# Patient Record
Sex: Female | Born: 1985 | Race: White | Hispanic: No | Marital: Married | Smoking: Never smoker
Health system: Southern US, Community
[De-identification: ages and names within clinical notes are randomized; demographics above are authoritative.]

## PROBLEM LIST (undated history)

## (undated) DIAGNOSIS — Z789 Other specified health status: Secondary | ICD-10-CM

## (undated) DIAGNOSIS — A6000 Herpesviral infection of urogenital system, unspecified: Secondary | ICD-10-CM

## (undated) HISTORY — DX: Other specified health status: Z78.9

## (undated) HISTORY — DX: Herpesviral infection of urogenital system, unspecified: A60.00

---

## 2017-10-16 ENCOUNTER — Ambulatory Visit (INDEPENDENT_AMBULATORY_CARE_PROVIDER_SITE_OTHER): Payer: 59 | Admitting: Certified Nurse Midwife

## 2017-10-16 ENCOUNTER — Encounter: Payer: Self-pay | Admitting: Certified Nurse Midwife

## 2017-10-16 ENCOUNTER — Other Ambulatory Visit (HOSPITAL_COMMUNITY)
Admission: RE | Admit: 2017-10-16 | Discharge: 2017-10-16 | Disposition: A | Discharge status: Home / Self Care | Payer: 59 | Source: Ambulatory Visit | Attending: Certified Nurse Midwife | Admitting: Certified Nurse Midwife

## 2017-10-16 VITALS — BP 136/86 | HR 66 | Ht 64.0 in | Wt 146.0 lb

## 2017-10-16 DIAGNOSIS — Z1151 Encounter for screening for human papillomavirus (HPV): Secondary | ICD-10-CM | POA: Insufficient documentation

## 2017-10-16 DIAGNOSIS — Z01419 Encounter for gynecological examination (general) (routine) without abnormal findings: Secondary | ICD-10-CM | POA: Diagnosis not present

## 2017-10-16 DIAGNOSIS — E663 Overweight: Secondary | ICD-10-CM

## 2017-10-16 DIAGNOSIS — Z800 Family history of malignant neoplasm of digestive organs: Secondary | ICD-10-CM

## 2017-10-16 DIAGNOSIS — Z803 Family history of malignant neoplasm of breast: Secondary | ICD-10-CM

## 2017-10-16 DIAGNOSIS — Z8619 Personal history of other infectious and parasitic diseases: Secondary | ICD-10-CM | POA: Diagnosis not present

## 2017-10-16 DIAGNOSIS — Z124 Encounter for screening for malignant neoplasm of cervix: Secondary | ICD-10-CM | POA: Insufficient documentation

## 2017-10-16 MED ORDER — VALACYCLOVIR HCL 500 MG PO TABS: 500.0000 mg | ORAL_TABLET | Freq: Every day | ORAL | 12 refills | Status: DC

## 2017-10-16 NOTE — Progress Notes (Signed)
ANNUAL PREVENTATIVE CARE GYN  ENCOUNTER NOTE  Subjective:       Taylor Kirk is a 32 y.o. No obstetric history on file. female here for a routine annual gynecologic exam.  Current complaints: 1. Needs Pap 2. Fatigue 3. Decreased libido  Actively trying to conceive for the last two (2) months. Married since June.   Denies difficulty breathing or respiratory distress, chest pain, abdominal pain, excessive vaginal bleeding, dysuria, and leg pain or swelling.    Gynecologic History  Patient's last menstrual period was 09/25/2017 (exact date).  Period Cycle (Days): 32 Period Duration (Days): Five (5) to six (6)  Period Pattern: (!) Irregular  Contraception: none   Last Pap: 2016. Results were: normal  Obstetric History  OB History  Gravida Para Term Preterm AB Living  0 0 0 0 0 0  SAB TAB Ectopic Multiple Live Births  0 0 0 0 0    Past Medical History:  Diagnosis Date  . Genital herpes     History reviewed. No pertinent surgical history.   Allergies  Allergen Reactions  . Sulfa Antibiotics Rash    Social History   Socioeconomic History  . Marital status: Married    Spouse name: Not on file  . Number of children: Not on file  . Years of education: Not on file  . Highest education level: Not on file  Occupational History  . Occupation: Physical Therapist  Social Needs  . Financial resource strain: Not on file  . Food insecurity:    Worry: Not on file    Inability: Not on file  . Transportation needs:    Medical: Not on file    Non-medical: Not on file  Tobacco Use  . Smoking status: Not on file  Substance and Sexual Activity  . Alcohol use: Not on file  . Drug use: Not on file  . Sexual activity: Not on file  Lifestyle  . Physical activity:    Days per week: Not on file    Minutes per session: Not on file  . Stress: Not on file  Relationships  . Social connections:    Talks on phone: Not on file    Gets together: Not on file    Attends  religious service: Not on file    Active member of club or organization: Not on file    Attends meetings of clubs or organizations: Not on file    Relationship status: Married  . Intimate partner violence:    Fear of current or ex partner: Not on file    Emotionally abused: Not on file    Physically abused: Not on file    Forced sexual activity: Not on file  Other Topics Concern  . Not on file  Social History Narrative  . Not on file    Family History  Problem Relation Age of Onset  . Pancreatic cancer Paternal Grandfather   . Breast cancer Maternal Aunt     The following portions of the patient's history were reviewed and updated as appropriate: allergies, current medications, past family history, past medical history, past social history, past surgical history and problem list.  Review of Systems  ROS negative except as noted above. Information obtained from patient.    Objective:   BP 136/86 (BP Location: Left Arm, Cuff Size: Normal)   Pulse 66   Ht 5\' 4"  (1.626 m)   Wt 146 lb (66.2 kg)   LMP 09/25/2017 (Exact Date)   BMI 25.06 kg/m  CONSTITUTIONAL: Well-developed, well-nourished female in no acute distress.   PSYCHIATRIC: Normal mood and affect. Normal behavior. Normal judgment and thought content.  NEUROLGIC: Alert and oriented to person, place, and time. Normal muscle tone coordination. No cranial nerve deficit noted.  HENT:  Normocephalic, atraumatic, External right and left ear normal.   EYES: Conjunctivae and EOM are normal. Pupils are equal and round.   NECK: Normal range of motion, supple, no masses.  Normal thyroid.   SKIN: Skin is warm and dry. No rash noted. Not diaphoretic. No erythema. No pallor.  CARDIOVASCULAR: Normal heart rate noted, regular rhythm, no murmur.  RESPIRATORY: Clear to auscultation bilaterally. Effort and breath sounds normal, no problems with respiration noted.  BREASTS: Symmetric in size. No masses, skin changes, nipple  drainage, or lymphadenopathy.  ABDOMEN: Soft, normal bowel sounds, no distention noted.  No tenderness, rebound or guarding. Overweight.   PELVIC:  External Genitalia: Normal  Vagina: Normal  Cervix: Normal, Nabothian cyst present, Pap collected  Uterus: Normal  Adnexa: Normal   MUSCULOSKELETAL: Normal range of motion. No tenderness.  No cyanosis, clubbing, or edema.  2+ distal pulses.  LYMPHATIC: No Axillary, Supraclavicular, or Inguinal Adenopathy.  Assessment:   Annual gynecologic examination 32 y.o.   Contraception: none   Overweight   Problem List Items Addressed This Visit      Other   Overweight (BMI 25.0-29.9)   Relevant Orders   Comprehensive metabolic panel   Lipid panel   Hemoglobin A1c   History of herpes genitalis    Other Visit Diagnoses    Well woman exam    -  Primary   Relevant Orders   Thyroid Panel With TSH   CBC   Comprehensive metabolic panel   Lipid panel   Hemoglobin A1c   Cytology - PAP   Screening for cervical cancer       Relevant Orders   Cytology - PAP   Family history of pancreatic cancer       Family history of breast cancer          Plan:   Pap: Pap Co Test  Labs: See Orders   Rx: Valtrex, See orders   Routine preventative health maintenance measures emphasized: Preparing for pregnancy, Exercise/Diet/Weight control, Tobacco Warnings, Alcohol/Substance use risks and Stress Management; See AVS  Reviewed red flag symptoms and when to call  RTC x 1 year for Annual Exam or sooner if needed   Isaias Sakaiiffany Wilson, Student Nurse Practitioner [facility]   I have seen, interviewed, and examined the patient in conjunction with the [employer] [employer] Nurse Practitioner student and affirm the diagnosis and management plan.   Gunnar BullaJenkins Michelle Cope Marte, CNM [facility], [facility]

## 2017-10-16 NOTE — Patient Instructions (Addendum)
Preventive Care 18-39 Years, Female Preventive care refers to lifestyle choices and visits with your health care provider that can promote health and wellness. What does preventive care include?  A yearly physical exam. This is also called an annual well check.  Dental exams once or twice a year.  Routine eye exams. Ask your health care provider how often you should have your eyes checked.  Personal lifestyle choices, including: ? Daily care of your teeth and gums. ? Regular physical activity. ? Eating a healthy diet. ? Avoiding tobacco and drug use. ? Limiting alcohol use. ? Practicing safe sex. ? Taking vitamin and mineral supplements as recommended by your health care provider. What happens during an annual well check? The services and screenings done by your health care provider during your annual well check will depend on your age, overall health, lifestyle risk factors, and family history of disease. Counseling Your health care provider may ask you questions about your:  Alcohol use.  Tobacco use.  Drug use.  Emotional well-being.  Home and relationship well-being.  Sexual activity.  Eating habits.  Work and work Statistician.  Method of birth control.  Menstrual cycle.  Pregnancy history.  Screening You may have the following tests or measurements:  Height, weight, and BMI.  Diabetes screening. This is done by checking your blood sugar (glucose) after you have not eaten for a while (fasting).  Blood pressure.  Lipid and cholesterol levels. These may be checked every 5 years starting at age 66.  Skin check.  Hepatitis C blood test.  Hepatitis B blood test.  Sexually transmitted disease (STD) testing.  BRCA-related cancer screening. This may be done if you have a family history of breast, ovarian, tubal, or peritoneal cancers.  Pelvic exam and Pap test. This may be done every 3 years starting at age 40. Starting at age 59, this may be done every 5  years if you have a Pap test in combination with an HPV test.  Discuss your test results, treatment options, and if necessary, the need for more tests with your health care provider. Vaccines Your health care provider may recommend certain vaccines, such as:  Influenza vaccine. This is recommended every year.  Tetanus, diphtheria, and acellular pertussis (Tdap, Td) vaccine. You may need a Td booster every 10 years.  Varicella vaccine. You may need this if you have not been vaccinated.  HPV vaccine. If you are 69 or younger, you may need three doses over 6 months.  Measles, mumps, and rubella (MMR) vaccine. You may need at least one dose of MMR. You may also need a second dose.  Pneumococcal 13-valent conjugate (PCV13) vaccine. You may need this if you have certain conditions and were not previously vaccinated.  Pneumococcal polysaccharide (PPSV23) vaccine. You may need one or two doses if you smoke cigarettes or if you have certain conditions.  Meningococcal vaccine. One dose is recommended if you are age 27-21 years and a first-year college student living in a residence hall, or if you have one of several medical conditions. You may also need additional booster doses.  Hepatitis A vaccine. You may need this if you have certain conditions or if you travel or work in places where you may be exposed to hepatitis A.  Hepatitis B vaccine. You may need this if you have certain conditions or if you travel or work in places where you may be exposed to hepatitis B.  Haemophilus influenzae type b (Hib) vaccine. You may need this if  you have certain risk factors.  Talk to your health care provider about which screenings and vaccines you need and how often you need them. This information is not intended to replace advice given to you by your health care provider. Make sure you discuss any questions you have with your health care provider. Document Released: 04/05/2001 Document Revised: 10/28/2015  Document Reviewed: 12/09/2014 Elsevier Interactive Patient Education  2018 [employer].  Preparing for Pregnancy If you are considering becoming pregnant, make an appointment to see your regular health care provider to learn how to prepare for a safe and healthy pregnancy (preconception care). During a preconception care visit, your health care provider will:  Do a complete physical exam, including a Pap test.  Take a complete medical history.  Give you information, answer your questions, and help you resolve problems.  Preconception checklist Medical history  Tell your health care provider about any current or past medical conditions. Your pregnancy or your ability to become pregnant may be affected by chronic conditions, such as diabetes, chronic hypertension, and thyroid problems.  Include your family's medical history as well as your partner's medical history.  Tell your health care provider about any history of STIs (sexually transmitted infections).These can affect your pregnancy. In some cases, they can be passed to your baby. Discuss any concerns that you have about STIs.  If indicated, discuss the benefits of genetic testing. This testing will show whether there are any genetic conditions that may be passed from you or your partner to your baby.  Tell your health care provider about: ? Any problems you have had with conception or pregnancy. ? Any medicines you take. These include vitamins, herbal supplements, and over-the-counter medicines. ? Your history of immunizations. Discuss any vaccinations that you may need.  Diet  Ask your health care provider what to include in a healthy diet that has a balance of nutrients. This is especially important when you are pregnant or preparing to become pregnant.  Ask your health care provider to help you reach a healthy weight before pregnancy. ? If you are overweight, you may be at higher risk for certain complications, such as high  blood pressure, diabetes, and preterm birth. ? If you are underweight, you are more likely to have a baby who has a low birth weight.  Lifestyle, work, and home  Let your health care provider know: ? About any lifestyle habits that you have, such as alcohol use, drug use, or smoking. ? About recreational activities that may put you at risk during pregnancy, such as downhill skiing and certain exercise programs. ? Tell your health care provider about any international travel, especially any travel to places with an active [place] virus outbreak. ? About harmful substances that you may be exposed to at work or at home. These include chemicals, pesticides, radiation, or even litter boxes. ? If you do not feel safe at home.  Mental health  Tell your health care provider about: ? Any history of mental health conditions, including feelings of depression, sadness, or anxiety. ? Any medicines that you take for a mental health condition. These include herbs and supplements.  Home instructions to prepare for pregnancy Lifestyle  Eat a balanced diet. This includes fresh fruits and vegetables, whole grains, lean meats, low-fat dairy products, healthy fats, and foods that are high in fiber. Ask to meet with a nutritionist or registered dietitian for assistance with meal planning and goals.  Get regular exercise. Try to be active for at  least 30 minutes a day on most days of the week. Ask your health care provider which activities are safe during pregnancy.  Do not use any products that contain nicotine or tobacco, such as cigarettes and e-cigarettes. If you need help quitting, ask your health care provider.  Do not drink alcohol.  Do not take illegal drugs.  Maintain a healthy weight. Ask your health care provider what weight range is right for you.  General instructions  Keep an accurate record of your menstrual periods. This makes it easier for your health care provider to determine your baby's  due date.  Begin taking prenatal vitamins and folic acid supplements daily as directed by your health care provider.  Manage any chronic conditions, such as high blood pressure and diabetes, as told by your health care provider. This is important.  How do I know that I am pregnant? You may be pregnant if you have been sexually active and you miss your period. Symptoms of early pregnancy include:  Mild cramping.  Very light vaginal bleeding (spotting).  Feeling unusually tired.  Nausea and vomiting (morning sickness).  If you have any of these symptoms and you suspect that you might be pregnant, you can take a home pregnancy test. These tests check for a hormone in your urine (human chorionic gonadotropin, or hCG). A woman's body begins to make this hormone during early pregnancy. These tests are very accurate. Wait until at least the first day after you miss your period to take one. If the test shows that you are pregnant (you get a positive result), call your health care provider to make an appointment for prenatal care. What should I do if I become pregnant?  Make an appointment with your health care provider as soon as you suspect you are pregnant.  Do not use any products that contain nicotine, such as cigarettes, chewing tobacco, and e-cigarettes. If you need help quitting, ask your health care provider.  Do not drink alcoholic beverages. Alcohol is related to a number of birth defects.  Avoid toxic odors and chemicals.  You may continue to have sexual intercourse if it does not cause pain or other problems, such as vaginal bleeding. This information is not intended to replace advice given to you by your health care provider. Make sure you discuss any questions you have with your health care provider. Document Released: 01/21/2008 Document Revised: 10/06/2015 Document Reviewed: 08/30/2015 Elsevier Interactive Patient Education  [employer].

## 2017-10-17 LAB — COMPREHENSIVE METABOLIC PANEL
ALT: 10 IU/L (ref 0–32)
AST: 17 IU/L (ref 0–40)
Albumin/Globulin Ratio: 2.1 (ref 1.2–2.2)
Albumin: 4.7 g/dL (ref 3.5–5.5)
Alkaline Phosphatase: 32 IU/L — ABNORMAL LOW (ref 39–117)
BUN / CREAT RATIO: 15 (ref 9–23)
BUN: 11 mg/dL (ref 6–20)
Bilirubin Total: 0.5 mg/dL (ref 0.0–1.2)
CALCIUM: 9 mg/dL (ref 8.7–10.2)
CO2: 21 mmol/L (ref 20–29)
CREATININE: 0.74 mg/dL (ref 0.57–1.00)
Chloride: 101 mmol/L (ref 96–106)
GFR calc Af Amer: 123 mL/min/{1.73_m2} (ref >59)
GFR calc non Af Amer: 107 mL/min/{1.73_m2} (ref >59)
GLOBULIN, TOTAL: 2.2 g/dL (ref 1.5–4.5)
Glucose: 87 mg/dL (ref 65–99)
Potassium: 3.5 mmol/L (ref 3.5–5.2)
SODIUM: 139 mmol/L (ref 134–144)
Total Protein: 6.9 g/dL (ref 6.0–8.5)

## 2017-10-17 LAB — CBC
HEMATOCRIT: 39.6 % (ref 34.0–46.6)
HEMOGLOBIN: 13 g/dL (ref 11.1–15.9)
MCH: 30.1 pg (ref 26.6–33.0)
MCHC: 32.8 g/dL (ref 31.5–35.7)
MCV: 92 fL (ref 79–97)
Platelets: 227 10*3/uL (ref 150–450)
RBC: 4.32 x10E6/uL (ref 3.77–5.28)
RDW: 11.8 % — ABNORMAL LOW (ref 12.3–15.4)
WBC: 7.5 10*3/uL (ref 3.4–10.8)

## 2017-10-17 LAB — LIPID PANEL
CHOL/HDL RATIO: 2.5 ratio (ref 0.0–4.4)
Cholesterol, Total: 158 mg/dL (ref 100–199)
HDL: 62 mg/dL (ref >39)
LDL CALC: 80 mg/dL (ref 0–99)
TRIGLYCERIDES: 82 mg/dL (ref 0–149)
VLDL CHOLESTEROL CAL: 16 mg/dL (ref 5–40)

## 2017-10-17 LAB — THYROID PANEL WITH TSH
FREE THYROXINE INDEX: 2 (ref 1.2–4.9)
T3 Uptake Ratio: 26 % (ref 24–39)
T4, Total: 7.8 ug/dL (ref 4.5–12.0)
TSH: 3.34 u[IU]/mL (ref 0.450–4.500)

## 2017-10-17 LAB — HEMOGLOBIN A1C
Est. average glucose Bld gHb Est-mCnc: 94 mg/dL
Hgb A1c MFr Bld: 4.9 % (ref 4.8–5.6)

## 2017-10-18 LAB — CYTOLOGY - PAP
Diagnosis: NEGATIVE
HPV (WINDOPATH): NOT DETECTED

## 2017-12-07 ENCOUNTER — Encounter: Payer: Self-pay | Admitting: Certified Nurse Midwife

## 2017-12-07 ENCOUNTER — Ambulatory Visit (INDEPENDENT_AMBULATORY_CARE_PROVIDER_SITE_OTHER): Payer: 59 | Admitting: Certified Nurse Midwife

## 2017-12-07 ENCOUNTER — Other Ambulatory Visit: Payer: Self-pay | Admitting: Certified Nurse Midwife

## 2017-12-07 VITALS — BP 141/84 | HR 88 | Ht 64.0 in | Wt 146.8 lb

## 2017-12-07 DIAGNOSIS — N926 Irregular menstruation, unspecified: Secondary | ICD-10-CM

## 2017-12-07 DIAGNOSIS — O26859 Spotting complicating pregnancy, unspecified trimester: Secondary | ICD-10-CM | POA: Diagnosis not present

## 2017-12-07 DIAGNOSIS — N912 Amenorrhea, unspecified: Secondary | ICD-10-CM | POA: Diagnosis not present

## 2017-12-07 DIAGNOSIS — Z3201 Encounter for pregnancy test, result positive: Secondary | ICD-10-CM

## 2017-12-07 LAB — POCT URINE PREGNANCY: PREG TEST UR: POSITIVE — AB

## 2017-12-07 NOTE — Progress Notes (Signed)
GYN ENCOUNTER NOTE  Subjective:       Taylor Kirk is a 32 y.o. [id number] female here for pregnancy confirmation.   Endorses positive home pregnancy test, nausea without vomiting, and fatigue. Report pinked tinged spotting x 1 week with wiping that she first noticed after intercourse.   Denies difficulty breathing or respiratory distress, chest pain, abdominal pain, excessive vaginal bleeding, dysuria, and leg pain or swelling.    Gynecologic History  Patient's last menstrual period was 10/24/2017 (exact date).   Estimated date of birth: 07/31/2020.   Gestational age: 60 weeks 2 days.   Contraception: none   Last Pap: 09/2017. Results were: Negative/Negative  Obstetric History  OB History  Gravida Para Term Preterm AB Living  1 0 0 0 0 0  SAB TAB Ectopic Multiple Live Births  0 0 0 0 0    # Outcome Date GA Lbr Len/2nd Weight Sex Delivery Anes PTL Lv  1 Current             Past Medical History:  Diagnosis Date  . Genital herpes     No past surgical history on file.  Current Outpatient Medications on File Prior to Visit  Medication Sig Dispense Refill  . valACYclovir (VALTREX) 500 MG tablet Take 1 tablet (500 mg total) by mouth daily. Can increase to twice a day for 5 days in the event of a recurrence 30 tablet 12   No current facility-administered medications on file prior to visit.     Allergies  Allergen Reactions  . Sulfa Antibiotics Rash    Social History   Socioeconomic History  . Marital status: Married    Spouse name: Not on file  . Number of children: Not on file  . Years of education: Not on file  . Highest education level: Not on file  Occupational History  . Occupation: Physical Therapist  Social Needs  . Financial resource strain: Not on file  . Food insecurity:    Worry: Not on file    Inability: Not on file  . Transportation needs:    Medical: Not on file    Non-medical: Not on file  Tobacco Use  . Smoking status: Never Smoker   Substance and Sexual Activity  . Alcohol use: Not Currently  . Drug use: Not Currently  . Sexual activity: Not on file  Lifestyle  . Physical activity:    Days per week: Not on file    Minutes per session: Not on file  . Stress: Not on file  Relationships  . Social connections:    Talks on phone: Not on file    Gets together: Not on file    Attends religious service: Not on file    Active member of club or organization: Not on file    Attends meetings of clubs or organizations: Not on file    Relationship status: Married  . Intimate partner violence:    Fear of current or ex partner: Not on file    Emotionally abused: Not on file    Physically abused: Not on file    Forced sexual activity: Not on file  Other Topics Concern  . Not on file  Social History Narrative  . Not on file    Family History  Problem Relation Age of Onset  . Pancreatic cancer Paternal Grandfather   . Breast cancer Maternal Aunt     The following portions of the patient's history were reviewed and updated as appropriate: allergies, current medications, past family  history, past medical history, past social history, past surgical history and problem list.  Review of Systems  ROS negative except as noted above. Information obtained from patient.   Objective:   BP (!) 141/84   Pulse 88   Ht 5\' 4"  (1.626 m)   Wt 146 lb 12.8 oz (66.6 kg)   LMP 10/24/2017 (Exact Date)   BMI 25.20 kg/m    CONSTITUTIONAL: Well-developed, well-nourished female in no acute distress.   PHYSICAL EXAM: Not indicated.   POSITIVE URINE PREGNANCY TEST  Assessment:   1. Amenorrhea  - POCT urine pregnancy   2. Spotting in early pregnancy   Plan:   First trimester education; see AVS.   Encouraged prenatal vitamin with folic acid and DHA, samples given.   Reviewed red flag symptoms and when to call.   RTC x 1 week for dating/viability [place] and nurse intake or sooner if needed.    Gunnar Bulla,  CNM [facility], [facility]

## 2017-12-07 NOTE — Patient Instructions (Addendum)
Vaginal Bleeding During Pregnancy, First Trimester A small amount of bleeding (spotting) from the vagina is common in early pregnancy. Sometimes the bleeding is normal and is not a problem, and sometimes it is a sign of something serious. Be sure to tell your doctor about any bleeding from your vagina right away. Follow these instructions at home:  Watch your condition for any changes.  Follow your doctor's instructions about how active you can be.  If you are on bed rest: ? You may need to stay in bed and only get up to use the bathroom. ? You may be allowed to do some activities. ? If you need help, make plans for someone to help you.  Write down: ? The number of pads you use each day. ? How often you change pads. ? How soaked (saturated) your pads are.  Do not use tampons.  Do not douche.  Do not have sex or orgasms until your doctor says it is okay.  If you pass any tissue from your vagina, save the tissue so you can show it to your doctor.  Only take medicines as told by your doctor.  Do not take aspirin because it can make you bleed.  Keep all follow-up visits as told by your doctor. Contact a doctor if:  You bleed from your vagina.  You have cramps.  You have labor pains.  You have a fever that does not go away after you take medicine. Get help right away if:  You have very bad cramps in your back or belly (abdomen).  You pass large clots or tissue from your vagina.  You bleed more.  You feel light-headed or weak.  You pass out (faint).  You have chills.  You are leaking fluid or have a gush of fluid from your vagina.  You pass out while pooping (having a bowel movement). This information is not intended to replace advice given to you by your health care provider. Make sure you discuss any questions you have with your health care provider. Document Released: 06/24/2013 Document Revised: 07/16/2015 Document Reviewed: 10/15/2012 Elsevier Interactive  Patient Education  2018 [employer]. Common Medications Safe in Pregnancy  Acne:      Constipation:  Benzoyl Peroxide     Colace  Clindamycin      Dulcolax Suppository  Topica Erythromycin     Fibercon  Salicylic Acid      Metamucil         Miralax AVOID:        Senakot   Accutane    Cough:  Retin-A       Cough Drops  Tetracycline      Phenergan w/ Codeine if Rx  Minocycline      Robitussin (Plain & DM)  Antibiotics:     Crabs/Lice:  Ceclor       RID  Cephalosporins    AVOID:  E-Mycins      Kwell  Keflex  Macrobid/Macrodantin   Diarrhea:  Penicillin      Kao-Pectate  Zithromax      Imodium AD         PUSH FLUIDS AVOID:       Cipro     Fever:  Tetracycline      Tylenol (Regular or Extra  Minocycline       Strength)  Levaquin      Extra Strength-Do not          Exceed 8 tabs/24 hrs Caffeine:        <  268m/day (equiv. To 1 cup of coffee or  approx. 3 12 oz sodas)         Gas: Cold/Hayfever:       Gas-X  Benadryl      Mylicon  Claritin       Phazyme  **Claritin-D        Chlor-Trimeton    Headaches:  Dimetapp      ASA-Free Excedrin  Drixoral-Non-Drowsy     Cold Compress  Mucinex (Guaifenasin)     Tylenol (Regular or Extra  Sudafed/Sudafed-12 Hour     Strength)  **Sudafed PE Pseudoephedrine   Tylenol Cold & Sinus     Vicks Vapor Rub  Zyrtec  **AVOID if Problems With Blood Pressure         Heartburn: Avoid lying down for at least 1 hour after meals  Aciphex      Maalox     Rash:  Milk of Magnesia     Benadryl    Mylanta       1% Hydrocortisone Cream  Pepcid  Pepcid Complete   Sleep Aids:  Prevacid      Ambien   Prilosec       Benadryl  Rolaids       Chamomile Tea  Tums (Limit 4/day)     Unisom  Zantac       Tylenol PM         Warm milk-add vanilla or  Hemorrhoids:       Sugar for taste  Anusol/Anusol H.C.  (RX: Analapram 2.5%)  Sugar Substitutes:  Hydrocortisone OTC     Ok in moderation  Preparation H      Tucks        Vaseline lotion applied to  tissue with wiping    Herpes:     Throat:  Acyclovir      Oragel  Famvir  Valtrex     Vaccines:         Flu Shot Leg Cramps:       *Gardasil  Benadryl      Hepatitis A         Hepatitis B Nasal Spray:       Pneumovax  Saline Nasal Spray     [place]io Booster         Tetanus Nausea:       Tuberculosis test or PPD  Vitamin B6 25 mg TID   AVOID:    Dramamine      *Gardasil  Emetrol       Live Poliovirus  Ginger Root 250 mg QID    MMR (measles, mumps &  High Complex Carbs @ Bedtime    rebella)  Sea Bands-Accupressure    Varicella (Chickenpox)  Unisom 1/2 tab TID     *No known complications           If received before Pain:         Known pregnancy;   Darvocet       Resume series after  Lortab        Delivery  Percocet    Yeast:   Tramadol      Femstat  Tylenol 3      Gyne-lotrimin  Ultram       Monistat  Vicodin           MISC:         All Sunscreens           Hair Coloring/highlights          Insect  Repellant's          (Including DEET)         Mystic Tans Morning Sickness Morning sickness is when you feel sick to your stomach (nauseous) during pregnancy. You may feel sick to your stomach and throw up (vomit). You may feel sick in the morning, but you can feel this way any time of day. Some women feel very sick to their stomach and cannot stop throwing up (hyperemesis gravidarum). Follow these instructions at home:  Only take medicines as told by your doctor.  Take multivitamins as told by your doctor. Taking multivitamins before getting pregnant can stop or lessen the harshness of morning sickness.  Eat dry toast or unsalted crackers before getting out of bed.  Eat 5 to 6 small meals a day.  Eat dry and bland foods like rice and baked potatoes.  Do not drink liquids with meals. Drink between meals.  Do not eat greasy, fatty, or spicy foods.  Have someone cook for you if the smell of food causes you to feel sick or throw up.  If you feel sick to your stomach after  taking prenatal vitamins, take them at night or with a snack.  Eat protein when you need a snack (nuts, yogurt, cheese).  Eat unsweetened gelatins for dessert.  Wear a bracelet used for sea sickness (acupressure wristband).  Go to a doctor that puts thin needles into certain body points (acupuncture) to improve how you feel.  Do not smoke.  Use a humidifier to keep the air in your house free of odors.  Get lots of fresh air. Contact a doctor if:  You need medicine to feel better.  You feel dizzy or lightheaded.  You are losing weight. Get help right away if:  You feel very sick to your stomach and cannot stop throwing up.  You pass out (faint). This information is not intended to replace advice given to you by your health care provider. Make sure you discuss any questions you have with your health care provider. Document Released: 03/17/2004 Document Revised: 07/16/2015 Document Reviewed: 07/25/2012 Elsevier Interactive Patient Education  2017 [place] of Pregnancy The first trimester of pregnancy is from week 1 until the end of week 13 (months 1 through 3). During this time, your baby will begin to develop inside you. At 6-8 weeks, the eyes and face are formed, and the heartbeat can be seen on ultrasound. At the end of 12 weeks, all the baby's organs are formed. Prenatal care is all the medical care you receive before the birth of your baby. Make sure you get good prenatal care and follow all of your doctor's instructions. Follow these instructions at home: Medicines  Take over-the-counter and prescription medicines only as told by your doctor. Some medicines are safe and some medicines are not safe during pregnancy.  Take a prenatal vitamin that contains at least 600 micrograms (mcg) of folic acid.  If you have trouble pooping (constipation), take medicine that will make your stool soft (stool softener) if your doctor approves. Eating and  drinking  Eat regular, healthy meals.  Your doctor will tell you the amount of weight gain that is right for you.  Avoid raw meat and uncooked cheese.  If you feel sick to your stomach (nauseous) or throw up (vomit): ? Eat 4 or 5 small meals a day instead of 3 large meals. ? Try eating a few soda crackers. ? Drink liquids between meals instead of during  meals.  To prevent constipation: ? Eat foods that are high in fiber, like fresh fruits and vegetables, whole grains, and beans. ? Drink enough fluids to keep your pee (urine) clear or pale yellow. Activity  Exercise only as told by your doctor. Stop exercising if you have cramps or pain in your lower belly (abdomen) or low back.  Do not exercise if it is too hot, too humid, or if you are in a place of great height (high altitude).  Try to avoid standing for long periods of time. Move your legs often if you must stand in one place for a long time.  Avoid heavy lifting.  Wear low-heeled shoes. Sit and stand up straight.  You can have sex unless your doctor tells you not to. Relieving pain and discomfort  Wear a good support bra if your breasts are sore.  Take warm water baths (sitz baths) to soothe pain or discomfort caused by hemorrhoids. Use hemorrhoid cream if your doctor says it is okay.  Rest with your legs raised if you have leg cramps or low back pain.  If you have puffy, bulging veins (varicose veins) in your legs: ? Wear support hose or compression stockings as told by your doctor. ? Raise (elevate) your feet for 15 minutes, 3-4 times a day. ? Limit salt in your food. Prenatal care  Schedule your prenatal visits by the twelfth week of pregnancy.  Write down your questions. Take them to your prenatal visits.  Keep all your prenatal visits as told by your doctor. This is important. Safety  Wear your seat belt at all times when driving.  Make a list of emergency phone numbers. The list should include numbers for  family, friends, the hospital, and police and fire departments. General instructions  Ask your doctor for a referral to a local prenatal class. Begin classes no later than at the start of month 6 of your pregnancy.  Ask for help if you need counseling or if you need help with nutrition. Your doctor can give you advice or tell you where to go for help.  Do not use hot tubs, steam rooms, or saunas.  Do not douche or use tampons or scented sanitary pads.  Do not cross your legs for long periods of time.  Avoid all herbs and alcohol. Avoid drugs that are not approved by your doctor.  Do not use any tobacco products, including cigarettes, chewing tobacco, and electronic cigarettes. If you need help quitting, ask your doctor. You may get counseling or other support to help you quit.  Avoid cat litter boxes and soil used by cats. These carry germs that can cause birth defects in the baby and can cause a loss of your baby (miscarriage) or stillbirth.  Visit your dentist. At home, brush your teeth with a soft toothbrush. Be gentle when you floss. Contact a doctor if:  You are dizzy.  You have mild cramps or pressure in your lower belly.  You have a nagging pain in your belly area.  You continue to feel sick to your stomach, you throw up, or you have watery poop (diarrhea).  You have a bad smelling fluid coming from your vagina.  You have pain when you pee (urinate).  You have increased puffiness (swelling) in your face, hands, legs, or ankles. Get help right away if:  You have a fever.  You are leaking fluid from your vagina.  You have spotting or bleeding from your vagina.  You have very  bad belly cramping or pain.  You gain or lose weight rapidly.  You throw up blood. It may look like coffee grounds.  You are around people who have [place] measles, fifth disease, or chickenpox.  You have a very bad headache.  You have shortness of breath.  You have any kind of trauma,  such as from a fall or a car accident. Summary  The first trimester of pregnancy is from week 1 until the end of week 13 (months 1 through 3).  To take care of yourself and your unborn baby, you will need to eat healthy meals, take medicines only if your doctor tells you to do so, and do activities that are safe for you and your baby.  Keep all follow-up visits as told by your doctor. This is important as your doctor will have to ensure that your baby is healthy and growing well. This information is not intended to replace advice given to you by your health care provider. Make sure you discuss any questions you have with your health care provider. Document Released: 07/27/2007 Document Revised: 02/16/2016 Document Reviewed: 02/16/2016 Elsevier Interactive Patient Education  2017 [place] for Pregnant Women While you are pregnant, your body will require additional nutrition to help support your growing baby. It is recommended that you consume:  150 additional calories each day during your first trimester.  300 additional calories each day during your second trimester.  300 additional calories each day during your third trimester.  Eating a healthy, well-balanced diet is very important for your health and for your baby's health. You also have a higher need for some vitamins and minerals, such as folic acid, calcium, iron, and vitamin D. What do I need to know about eating during pregnancy?  Do not try to lose weight or go on a diet during pregnancy.  Choose healthy, nutritious foods. Choose  of a sandwich with a glass of milk instead of a candy bar or a high-calorie sugar-sweetened beverage.  Limit your overall intake of foods that have "empty calories." These are foods that have little nutritional value, such as sweets, desserts, candies, sugar-sweetened beverages, and fried foods.  Eat a variety of foods, especially fruits and vegetables.  Take a prenatal vitamin  to help meet the additional needs during pregnancy, specifically for folic acid, iron, calcium, and vitamin D.  Remember to stay active. Ask your health care provider for exercise recommendations that are specific to you.  Practice good food safety and cleanliness, such as washing your hands before you eat and after you prepare raw meat. This helps to prevent foodborne illnesses, such as listeriosis, that can be very dangerous for your baby. Ask your health care provider for more information about listeriosis. What does 150 extra calories look like? Healthy options for an additional 150 calories each day could be any of the following:  Plain low-fat yogurt (6-8 oz) with  cup of berries.  1 apple with 2 teaspoons of peanut butter.  Cut-up vegetables with  cup of hummus.  Low-fat chocolate milk (8 oz or 1 cup).  1 string cheese with 1 medium orange.   of a peanut butter and jelly sandwich on whole-wheat bread (1 tsp of peanut butter).  For 300 calories, you could eat two of those healthy options each day. What is a healthy amount of weight to gain? The recommended amount of weight for you to gain is based on your pre-pregnancy BMI. If your pre-pregnancy BMI was:  Less than 18 (underweight), you should gain 28-40 lb.  18-24.9 (normal), you should gain 25-35 lb.  25-29.9 (overweight), you should gain 15-25 lb.  Greater than 30 (obese), you should gain 11-20 lb.  What if I am having twins or multiples? Generally, pregnant women who will be having twins or multiples may need to increase their daily calories by 300-600 calories each day. The recommended range for total weight gain is 25-54 lb, depending on your pre-pregnancy BMI. Talk with your health care provider for specific guidance about additional nutritional needs, weight gain, and exercise during your pregnancy. What foods can I eat? Grains Any grains. Try to choose whole grains, such as whole-wheat bread, oatmeal, or brown  rice. Vegetables Any vegetables. Try to eat a variety of colors and types of vegetables to get a full range of vitamins and minerals. Remember to wash your vegetables well before eating. Fruits Any fruits. Try to eat a variety of colors and types of fruit to get a full range of vitamins and minerals. Remember to wash your fruits well before eating. Meats and Other Protein Sources Lean meats, including chicken, [place], fish, and lean cuts of beef, veal, or pork. Make sure that all meats are cooked to "well done." Tofu. Tempeh. Beans. Eggs. Peanut butter and other nut butters. Seafood, such as shrimp, crab, and lobster. If you choose fish, select types that are higher in omega-3 fatty acids, including salmon, herring, mussels, trout, sardines, and pollock. Make sure that all meats are cooked to food-safe temperatures. Dairy Pasteurized milk and milk alternatives. Pasteurized yogurt and pasteurized cheese. Cottage cheese. Sour cream. Beverages Water. Juices that contain 100% fruit juice or vegetable juice. Caffeine-free teas and decaffeinated coffee. Drinks that contain caffeine are okay to drink, but it is better to avoid caffeine. Keep your total caffeine intake to less than 200 mg each day (12 oz of coffee, tea, or soda) or as directed by your health care provider. Condiments Any pasteurized condiments. Sweets and Desserts Any sweets and desserts. Fats and Oils Any fats and oils. The items listed above may not be a complete list of recommended foods or beverages. Contact your dietitian for more options. What foods are not recommended? Vegetables Unpasteurized (raw) vegetable juices. Fruits Unpasteurized (raw) fruit juices. Meats and Other Protein Sources Cured meats that have nitrates, such as bacon, salami, and hotdogs. Luncheon meats, bologna, or other deli meats (unless they are reheated until they are steaming hot). Refrigerated pate, meat spreads from a meat counter, smoked seafood that  is found in the refrigerated section of a store. Raw fish, such as sushi or sashimi. High mercury content fish, such as tilefish, shark, swordfish, and king mackerel. Raw meats, such as tuna or beef tartare. Undercooked meats and poultry. Make sure that all meats are cooked to food-safe temperatures. Dairy Unpasteurized (raw) milk and any foods that have raw milk in them. Soft cheeses, such as feta, queso blanco, queso fresco, Brie, Camembert cheeses, blue-veined cheeses, and Panela cheese (unless it is made with pasteurized milk, which must be stated on the label). Beverages Alcohol. Sugar-sweetened beverages, such as sodas, teas, or energy drinks. Condiments Homemade fermented foods and drinks, such as pickles, sauerkraut, or kombucha drinks. (Store-bought pasteurized versions of these are okay.) Other Salads that are made in the store, such as ham salad, chicken salad, egg salad, tuna salad, and seafood salad. The items listed above may not be a complete list of foods and beverages to avoid. Contact your dietitian for more  information. This information is not intended to replace advice given to you by your health care provider. Make sure you discuss any questions you have with your health care provider. Document Released: 11/22/2013 Document Revised: 07/16/2015 Document Reviewed: 07/23/2013 Elsevier Interactive Patient Education  2018 [place].  WHAT OB PATIENTS CAN EXPECT   Confirmation of pregnancy and ultrasound ordered if medically indicated-[redacted] weeks gestation  New OB (NOB) intake with nurse and New OB (NOB) labs- [redacted] weeks gestation  New OB (NOB) physical examination with provider- 11/[redacted] weeks gestation  Flu vaccine-[redacted] weeks gestation  Anatomy scan-[redacted] weeks gestation  Glucose tolerance test, blood work to test for anemia, T-dap vaccine-[redacted] weeks gestation  Vaginal swabs/cultures-STD/Group B strep-[redacted] weeks gestation  Appointments every 4 weeks until 28 weeks  Every 2 weeks  from 28 weeks until 36 weeks  Weekly visits from 36 weeks until delivery

## 2017-12-12 ENCOUNTER — Ambulatory Visit (INDEPENDENT_AMBULATORY_CARE_PROVIDER_SITE_OTHER): Payer: 59

## 2017-12-12 DIAGNOSIS — O3411 Maternal care for benign tumor of corpus uteri, first trimester: Secondary | ICD-10-CM | POA: Diagnosis not present

## 2017-12-12 DIAGNOSIS — N8312 Corpus luteum cyst of left ovary: Secondary | ICD-10-CM | POA: Diagnosis not present

## 2017-12-12 DIAGNOSIS — Z3A01 Less than 8 weeks gestation of pregnancy: Secondary | ICD-10-CM

## 2017-12-12 DIAGNOSIS — N926 Irregular menstruation, unspecified: Secondary | ICD-10-CM

## 2017-12-12 DIAGNOSIS — Z3201 Encounter for pregnancy test, result positive: Secondary | ICD-10-CM

## 2017-12-25 ENCOUNTER — Ambulatory Visit (INDEPENDENT_AMBULATORY_CARE_PROVIDER_SITE_OTHER): Payer: 59 | Admitting: Certified Nurse Midwife

## 2017-12-25 VITALS — BP 142/80 | HR 66 | Ht 65.0 in | Wt 144.6 lb

## 2017-12-25 DIAGNOSIS — Z113 Encounter for screening for infections with a predominantly sexual mode of transmission: Secondary | ICD-10-CM

## 2017-12-25 DIAGNOSIS — Z3401 Encounter for supervision of normal first pregnancy, first trimester: Secondary | ICD-10-CM

## 2017-12-25 NOTE — Progress Notes (Signed)
      Taylor Kirk presents for NOB nurse intake visit. Pregnancy confirmation done at [facility],12/07/17, with Holly Bodily.  G1.  P0.  LMP 10/24/17.  EDD 08/05/18.  [place]. Pregnancy education material explained and given. 0 cats in the home.  NOB labs ordered. BMI greater less than 30. HIV and drug screen explained and ordered. Genetic screening discussed. Genetic testing; Unsure. Pt to discuss genetic testing with provider. PNV encouraged. Pt to follow up with provider in  3 weeks for NOB physical.

## 2017-12-25 NOTE — Progress Notes (Signed)
I have reviewed the record and concur with patient management and plan of care.    Oline Belk Michelle Wessley Emert, CNM [facility] 

## 2017-12-26 LAB — URINALYSIS, ROUTINE W REFLEX MICROSCOPIC
BILIRUBIN UA: NEGATIVE
Glucose, UA: NEGATIVE
Ketones, UA: NEGATIVE
Leukocytes, UA: NEGATIVE
NITRITE UA: NEGATIVE
PH UA: 8 — AB (ref 5.0–7.5)
Protein, UA: NEGATIVE
RBC UA: NEGATIVE
SPEC GRAV UA: 1.009 (ref 1.005–1.030)
Urobilinogen, Ur: 0.2 mg/dL (ref 0.2–1.0)

## 2017-12-26 LAB — DRUG PROFILE, UR, 9 DRUGS (LABCORP)
AMPHETAMINES, URINE: NEGATIVE ng/mL
Barbiturate Quant, Ur: NEGATIVE ng/mL
Benzodiazepine Quant, Ur: NEGATIVE ng/mL
CANNABINOID QUANT UR: NEGATIVE ng/mL
COCAINE (METAB.): NEGATIVE ng/mL
Methadone Screen, Urine: NEGATIVE ng/mL
OPIATE QUANT UR: NEGATIVE ng/mL
PCP Quant, Ur: NEGATIVE ng/mL
PROPOXYPHENE: NEGATIVE ng/mL

## 2017-12-26 LAB — NICOTINE SCREEN, URINE: Cotinine Ql Scrn, Ur: NEGATIVE ng/mL

## 2017-12-27 LAB — ANTIBODY SCREEN

## 2017-12-27 LAB — URINE CULTURE, OB REFLEX

## 2017-12-27 LAB — AB SCR+ANTIBODY ID

## 2017-12-27 LAB — CULTURE, OB URINE

## 2017-12-27 LAB — GC/CHLAMYDIA PROBE AMP
Chlamydia trachomatis, NAA: NEGATIVE
Neisseria gonorrhoeae by PCR: NEGATIVE

## 2017-12-27 LAB — HEPATITIS B SURFACE ANTIGEN: Hepatitis B Surface Ag: NEGATIVE

## 2017-12-27 LAB — HIV ANTIBODY (ROUTINE TESTING W REFLEX): HIV Screen 4th Generation wRfx: NONREACTIVE

## 2017-12-27 LAB — RPR, QUANT+TP ABS (REFLEX)
Rapid Plasma Reagin, Quant: 1:4 {titer} — ABNORMAL HIGH
TREPONEMA PALLIDUM AB: NEGATIVE

## 2017-12-27 LAB — VARICELLA ZOSTER ANTIBODY, IGG: VARICELLA: 1176 {index} (ref >165)

## 2017-12-27 LAB — RPR: RPR Ser Ql: REACTIVE — AB

## 2017-12-27 LAB — ABO AND RH: Rh Factor: POSITIVE

## 2017-12-27 LAB — RUBELLA SCREEN: Rubella Antibodies, IGG: 4.2 index (ref >0.99)

## 2018-01-15 ENCOUNTER — Ambulatory Visit (INDEPENDENT_AMBULATORY_CARE_PROVIDER_SITE_OTHER): Payer: 59 | Admitting: Certified Nurse Midwife

## 2018-01-15 VITALS — BP 136/85 | HR 69 | Wt 143.6 lb

## 2018-01-15 DIAGNOSIS — Z3401 Encounter for supervision of normal first pregnancy, first trimester: Secondary | ICD-10-CM

## 2018-01-15 LAB — POCT URINALYSIS DIPSTICK OB
Bilirubin, UA: NEGATIVE
Blood, UA: NEGATIVE
Glucose, UA: NEGATIVE
KETONES UA: NEGATIVE
Leukocytes, UA: NEGATIVE
NITRITE UA: NEGATIVE
PH UA: 7.5 (ref 5.0–8.0)
PROTEIN: NEGATIVE
Spec Grav, UA: 1.015 (ref 1.010–1.025)
UROBILINOGEN UA: 0.2 U/dL

## 2018-01-15 NOTE — Patient Instructions (Addendum)
[place], [place], [place] [address]  Phone: [phone]   [facility] (second location)  [address] [place], [place] [address]  Phone: [phone]   [facility]) [address] [place], [place], [place] [address] Phone: [phone]   [address] [place], [place] [address]  Phone: [phone] Choosing to breastfeed is one of the best decisions you can make for yourself and your baby. A change in hormones during pregnancy causes your breasts to make breast milk in your milk-producing glands. Hormones prevent breast milk from being released before your baby is born. They also prompt milk flow after birth. Once breastfeeding has begun, thoughts of your baby, as well as his or her sucking or crying, can stimulate the release of milk from your milk-producing glands. Benefits of breastfeeding Research shows that breastfeeding offers many health benefits for infants and mothers. It also offers a cost-free and convenient way to feed your baby. For your baby  Your first milk (colostrum) helps your baby's digestive system to function better.  Special cells in your milk (antibodies) help your baby to fight off infections.  Breastfed babies are less likely to develop asthma, allergies, obesity, or type 2 diabetes. They are also at lower risk for sudden infant death syndrome (SIDS).  Nutrients in breast milk are better able to meet your baby's needs compared to infant formula.  Breast milk improves your baby's brain development. For you  Breastfeeding helps to create a very special bond between you and your baby.  Breastfeeding is convenient. Breast milk costs nothing and is always available at the correct temperature.  Breastfeeding helps to burn calories. It helps you to lose the weight that you gained during pregnancy.  Breastfeeding makes your uterus  return faster to its size before pregnancy. It also slows bleeding (lochia) after you give birth.  Breastfeeding helps to lower your risk of developing type 2 diabetes, osteoporosis, rheumatoid arthritis, cardiovascular disease, and breast, ovarian, uterine, and endometrial cancer later in life. Breastfeeding basics Starting breastfeeding  Find a comfortable place to sit or lie down, with your neck and back well-supported.  Place a pillow or a rolled-up blanket under your baby to bring him or her to the level of your breast (if you are seated). Nursing pillows are specially designed to help support your arms and your baby while you breastfeed.  Make sure that your baby's tummy (abdomen) is facing your abdomen.  Gently massage your breast. With your fingertips, massage from the outer edges of your breast inward toward the nipple. This encourages milk flow. If your milk flows slowly, you may need to continue this action during the feeding.  Support your breast with 4 fingers underneath and your thumb above your nipple (make the letter "C" with your hand). Make sure your fingers are well away from your nipple and your baby's mouth.  Stroke your baby's lips gently with your finger or nipple.  When your baby's mouth is open wide enough, quickly bring your baby to your breast, placing your entire nipple and as much of the areola as possible into your baby's mouth. The areola is the colored area around your nipple. ? More areola should be visible above your baby's upper lip than below the lower lip. ? Your baby's lips should be opened and extended outward (flanged) to ensure an adequate, comfortable latch. ? Your baby's tongue should be between his or her  lower gum and your breast.  Make sure that your baby's mouth is correctly positioned around your nipple (latched). Your baby's lips should create a seal on your breast and be turned out (everted).  It is common for your baby to suck about 2-3  minutes in order to start the flow of breast milk. Latching Teaching your baby how to latch onto your breast properly is very important. An improper latch can cause nipple pain, decreased milk supply, and poor weight gain in your baby. Also, if your baby is not latched onto your nipple properly, he or she may swallow some air during feeding. This can make your baby fussy. Burping your baby when you switch breasts during the feeding can help to get rid of the air. However, teaching your baby to latch on properly is still the best way to prevent fussiness from swallowing air while breastfeeding. Signs that your baby has successfully latched onto your nipple  Silent tugging or silent sucking, without causing you pain. Infant's lips should be extended outward (flanged).  Swallowing heard between every 3-4 sucks once your milk has started to flow (after your let-down milk reflex occurs).  Muscle movement above and in front of his or her ears while sucking.  Signs that your baby has not successfully latched onto your nipple  Sucking sounds or smacking sounds from your baby while breastfeeding.  Nipple pain.  If you think your baby has not latched on correctly, slip your finger into the corner of your baby's mouth to break the suction and place it between your baby's gums. Attempt to start breastfeeding again. Signs of successful breastfeeding Signs from your baby  Your baby will gradually decrease the number of sucks or will completely stop sucking.  Your baby will fall asleep.  Your baby's body will relax.  Your baby will retain a small amount of milk in his or her mouth.  Your baby will let go of your breast by himself or herself.  Signs from you  Breasts that have increased in firmness, weight, and size 1-3 hours after feeding.  Breasts that are softer immediately after breastfeeding.  Increased milk volume, as well as a change in milk consistency and color by the fifth day of  breastfeeding.  Nipples that are not sore, cracked, or bleeding.  Signs that your baby is getting enough milk  Wetting at least 1-2 diapers during the first 24 hours after birth.  Wetting at least 5-6 diapers every 24 hours for the first week after birth. The urine should be clear or pale yellow by the age of 5 days.  Wetting 6-8 diapers every 24 hours as your baby continues to grow and develop.  At least 3 stools in a 24-hour period by the age of 5 days. The stool should be soft and yellow.  At least 3 stools in a 24-hour period by the age of 7 days. The stool should be seedy and yellow.  No loss of weight greater than 10% of birth weight during the first 3 days of life.  Average weight gain of 4-7 oz (113-198 g) per week after the age of 4 days.  Consistent daily weight gain by the age of 5 days, without weight loss after the age of 2 weeks. After a feeding, your baby may spit up a small amount of milk. This is normal. Breastfeeding frequency and duration Frequent feeding will help you make more milk and can prevent sore nipples and extremely full breasts (breast engorgement). Breastfeed  when you feel the need to reduce the fullness of your breasts or when your baby shows signs of hunger. This is called "breastfeeding on demand." Signs that your baby is hungry include:  Increased alertness, activity, or restlessness.  Movement of the head from side to side.  Opening of the mouth when the corner of the mouth or cheek is stroked (rooting).  Increased sucking sounds, smacking lips, cooing, sighing, or squeaking.  Hand-to-mouth movements and sucking on fingers or hands.  Fussing or crying.  Avoid introducing a pacifier to your baby in the first 4-6 weeks after your baby is born. After this time, you may choose to use a pacifier. Research has shown that pacifier use during the first year of a baby's life decreases the risk of sudden infant death syndrome (SIDS). Allow your baby to  feed on each breast as long as he or she wants. When your baby unlatches or falls asleep while feeding from the first breast, offer the second breast. Because newborns are often sleepy in the first few weeks of life, you may need to awaken your baby to get him or her to feed. Breastfeeding times will vary from baby to baby. However, the following rules can serve as a guide to help you make sure that your baby is properly fed:  Newborns (babies 83 weeks of age or younger) may breastfeed every 1-3 hours.  Newborns should not go without breastfeeding for longer than 3 hours during the day or 5 hours during the night.  You should breastfeed your baby a minimum of 8 times in a 24-hour period.  Breast milk pumping Pumping and storing breast milk allows you to make sure that your baby is exclusively fed your breast milk, even at times when you are unable to breastfeed. This is especially important if you go back to work while you are still breastfeeding, or if you are not able to be present during feedings. Your lactation consultant can help you find a method of pumping that works best for you and give you guidelines about how long it is safe to store breast milk. Caring for your breasts while you breastfeed Nipples can become dry, cracked, and sore while breastfeeding. The following recommendations can help keep your breasts moisturized and healthy:  Avoid using soap on your nipples.  Wear a supportive bra designed especially for nursing. Avoid wearing underwire-style bras or extremely tight bras (sports bras).  Air-dry your nipples for 3-4 minutes after each feeding.  Use only cotton bra pads to absorb leaked breast milk. Leaking of breast milk between feedings is normal.  Use lanolin on your nipples after breastfeeding. Lanolin helps to maintain your skin's normal moisture barrier. Pure lanolin is not harmful (not toxic) to your baby. You may also hand express a few drops of breast milk and gently  massage that milk into your nipples and allow the milk to air-dry.  In the first few weeks after giving birth, some women experience breast engorgement. Engorgement can make your breasts feel heavy, warm, and tender to the touch. Engorgement peaks within 3-5 days after you give birth. The following recommendations can help to ease engorgement:  Completely empty your breasts while breastfeeding or pumping. You may want to start by applying warm, moist heat (in the shower or with warm, water-soaked hand towels) just before feeding or pumping. This increases circulation and helps the milk flow. If your baby does not completely empty your breasts while breastfeeding, pump any extra milk after he  or she is finished.  Apply ice packs to your breasts immediately after breastfeeding or pumping, unless this is too uncomfortable for you. To do this: ? Put ice in a plastic bag. ? Place a towel between your skin and the bag. ? Leave the ice on for 20 minutes, 2-3 times a day.  Make sure that your baby is latched on and positioned properly while breastfeeding.  If engorgement persists after 48 hours of following these recommendations, contact your health care provider or a Science writer. Overall health care recommendations while breastfeeding  Eat 3 healthy meals and 3 snacks every day. Well-nourished mothers who are breastfeeding need an additional 450-500 calories a day. You can meet this requirement by increasing the amount of a balanced diet that you eat.  Drink enough water to keep your urine pale yellow or clear.  Rest often, relax, and continue to take your prenatal vitamins to prevent fatigue, stress, and low vitamin and mineral levels in your body (nutrient deficiencies).  Do not use any products that contain nicotine or tobacco, such as cigarettes and e-cigarettes. Your baby may be harmed by chemicals from cigarettes that pass into breast milk and exposure to secondhand smoke. If you need  help quitting, ask your health care provider.  Avoid alcohol.  Do not use illegal drugs or marijuana.  Talk with your health care provider before taking any medicines. These include over-the-counter and prescription medicines as well as vitamins and herbal supplements. Some medicines that may be harmful to your baby can pass through breast milk.  It is possible to become pregnant while breastfeeding. If birth control is desired, ask your health care provider about options that will be safe while breastfeeding your baby. Where to find more information: [employer]: www.llli.org Contact a health care provider if:  You feel like you want to stop breastfeeding or have become frustrated with breastfeeding.  Your nipples are cracked or bleeding.  Your breasts are red, tender, or warm.  You have: ? Painful breasts or nipples. ? A swollen area on either breast. ? A fever or chills. ? Nausea or vomiting. ? Drainage other than breast milk from your nipples.  Your breasts do not become full before feedings by the fifth day after you give birth.  You feel sad and depressed.  Your baby is: ? Too sleepy to eat well. ? Having trouble sleeping. ? More than 61 week old and wetting fewer than 6 diapers in a 24-hour period. ? Not gaining weight by 72 days of age.  Your baby has fewer than 3 stools in a 24-hour period.  Your baby's skin or the white parts of his or her eyes become yellow. Get help right away if:  Your baby is overly tired (lethargic) and does not want to wake up and feed.  Your baby develops an unexplained fever. Summary  Breastfeeding offers many health benefits for infant and mothers.  Try to breastfeed your infant when he or she shows early signs of hunger.  Gently tickle or stroke your baby's lips with your finger or nipple to allow the baby to open his or her mouth. Bring the baby to your breast. Make sure that much of the areola is in your baby's  mouth. Offer one side and burp the baby before you offer the other side.  Talk with your health care provider or lactation consultant if you have questions or you face problems as you breastfeed. This information is not intended to replace  advice given to you by your health care provider. Make sure you discuss any questions you have with your health care provider. Document Released: 02/07/2005 Document Revised: 03/11/2016 Document Reviewed: 03/11/2016 Elsevier Interactive Patient Education  2018 [employer]. Pain Relief During Labor and Delivery Many things can cause pain during labor and delivery, including:  Pressure on bones and ligaments due to the baby moving through the pelvis.  Stretching of tissues due to the baby moving through the birth canal.  Muscle tension due to anxiety or nervousness.  The uterus tightening (contracting) and relaxing to help move the baby.  There are many ways to deal with the pain of labor and delivery. They include:  Taking prenatal classes. Taking these classes helps you know what to expect during your baby's birth. What you learn will increase your confidence and decrease your anxiety.  Practicing relaxation techniques or doing relaxing activities, such as: ? Focused breathing. ? Meditation. ? Visualization. ? Aroma therapy. ? Listening to your favorite music. ? Hypnosis.  Taking a warm shower or bath (hydrotherapy). This may: ? Provide comfort and relaxation. ? Lessen your perception of pain. ? Decrease the amount of pain medicine needed. ? Decrease the length of labor.  Getting a massage or counterpressure on your back.  Applying warm packs or ice packs.  Changing positions often, moving around, or using a birthing ball.  Getting: ? Pain medicine through an IV or injection into a muscle. ? Pain medicine inserted into your spinal column. ? Injections of sterile water just under the skin on your lower back (intradermal  injections). ? Laughing gas (nitrous oxide).  Discuss your pain control options with your health care provider during your prenatal visits. Explore the options offered by your hospital or birth center. What kinds of medicine are available? There are two kinds of medicines that can be used to relieve pain during labor and delivery:  Analgesics. These medicines decrease pain without causing you to lose feeling or the ability to move your muscles.  Anesthetics. These medicines block feeling in the body and can decrease your ability to move freely.  Both of these kinds of medicine can cause minor side effects, such as nausea, trouble concentrating, and sleepiness. They can also decrease the baby's heart rate before birth and affect the baby's breathing rate after birth. For this reason, health care providers are careful about when and how much medicine is given. What are specific medicines and procedures that provide pain relief? Local Anesthetics Local anesthetics are used to numb a small area of the body. They may be used along with another kind of anesthetic or used to numb the nerves of the vagina, cervix, and perineum during the second stage of labor. General Anesthetics General anesthetics cause you to lose consciousness so you do not feel pain. They are usually only used for an emergency cesarean delivery. General anesthetics are given through an IV tube and a mask. Pudendal Block A pudendal block is a form of local anesthetic. It may be used to relieve the pain associated with pushing or stretching of the perineum at the time of delivery or to further numb the perineum. A pudendal block is done by injecting numbing medicine through the vaginal wall into a nerve in the pelvis. Epidural Analgesia Epidural analgesia is given through a flexible IV catheter that is inserted into the lower back. Numbing medicine is delivered continuously to the area near your spinal column nerves (epidural space).  After having this type of analgesia,  you may be able to move your legs but you most likely will not be able to walk. Depending on the amount of medicine given, you may lose all feeling in the lower half of your body, or you may retain some level of sensation, including the urge to push. Epidural analgesia can be used to provide pain relief for a vaginal birth. Spinal Block A spinal block is similar to epidural analgesia, but the medicine is injected into the spinal fluid instead of the epidural space. A spinal block is only given once. It starts to relieve pain quickly, but the pain relief lasts only 1-6 hours. Spinal blocks can be used for cesarean deliveries. Combined Spinal-Epidural (CSE) Block A CSE block combines the effects of a spinal block and epidural analgesia. The spinal block works quickly to block all pain. The epidural analgesia provides continuous pain relief, even after the effects of the spinal block have worn off. This information is not intended to replace advice given to you by your health care provider. Make sure you discuss any questions you have with your health care provider. Document Released: 05/26/2008 Document Revised: 07/17/2015 Document Reviewed: 07/01/2015 Elsevier Interactive Patient Education  2018 [employer]. Round Ligament Pain The round ligament is a cord of muscle and tissue that helps to support the uterus. It can become a source of pain during pregnancy if it becomes stretched or twisted as the baby grows. The pain usually begins in the second trimester of pregnancy, and it can come and go until the baby is delivered. It is not a serious problem, and it does not cause harm to the baby. Round ligament pain is usually a short, sharp, and pinching pain, but it can also be a dull, lingering, and aching pain. The pain is felt in the lower side of the abdomen or in the groin. It usually starts deep in the groin and moves up to the outside of the hip area. Pain can occur  with:  A sudden change in position.  Rolling over in bed.  Coughing or sneezing.  Physical activity.  Follow these instructions at home: Watch your condition for any changes. Take these steps to help with your pain:  When the pain starts, relax. Then try: ? Sitting down. ? Flexing your knees up to your abdomen. ? Lying on your side with one pillow under your abdomen and another pillow between your legs. ? Sitting in a warm bath for 15-20 minutes or until the pain goes away.  Take over-the-counter and prescription medicines only as told by your health care provider.  Move slowly when you sit and stand.  Avoid long walks if they cause pain.  Stop or lessen your physical activities if they cause pain.  Contact a health care provider if:  Your pain does not go away with treatment.  You feel pain in your back that you did not have before.  Your medicine is not helping. Get help right away if:  You develop a fever or chills.  You develop uterine contractions.  You develop vaginal bleeding.  You develop nausea or vomiting.  You develop diarrhea.  You have pain when you urinate. This information is not intended to replace advice given to you by your health care provider. Make sure you discuss any questions you have with your health care provider. Document Released: 11/17/2007 Document Revised: 07/16/2015 Document Reviewed: 04/16/2014 Elsevier Interactive Patient Education  2018 [employer]. Back Pain in Pregnancy Back pain during pregnancy is common. Back  pain may be caused by several factors that are related to changes during your pregnancy. Follow these instructions at home: Managing pain, stiffness, and swelling  If directed, apply ice for sudden (acute) back pain. ? Put ice in a plastic bag. ? Place a towel between your skin and the bag. ? Leave the ice on for 20 minutes, 2-3 times per day.  If directed, apply heat to the affected area before you  exercise: ? Place a towel between your skin and the heat pack or heating pad. ? Leave the heat on for 20-30 minutes. ? Remove the heat if your skin turns bright red. This is especially important if you are unable to feel pain, heat, or cold. You may have a greater risk of getting burned. Activity  Exercise as told by your health care provider. Exercising is the best way to prevent or manage back pain.  Listen to your body when lifting. If lifting hurts, ask for help or bend your knees. This uses your leg muscles instead of your back muscles.  Squat down when picking up something from the floor. Do not bend over.  Only use bed rest as told by your health care provider. Bed rest should only be used for the most severe episodes of back pain. Standing, Sitting, and Lying Down  Do not stand in one place for long periods of time.  Use good posture when sitting. Make sure your head rests over your shoulders and is not hanging forward. Use a pillow on your lower back if necessary.  Try sleeping on your side, preferably the left side, with a pillow or two between your legs. If you are sore after a night's rest, your bed may be too soft. A firm mattress may provide more support for your back during pregnancy. General instructions  Do not wear high heels.  Eat a healthy diet. Try to gain weight within your health care provider's recommendations.  Use a maternity girdle, elastic sling, or back brace as told by your health care provider.  Take over-the-counter and prescription medicines only as told by your health care provider.  Keep all follow-up visits as told by your health care provider. This is important. This includes any visits with any specialists, such as a physical therapist. Contact a health care provider if:  Your back pain interferes with your daily activities.  You have increasing pain in other parts of your body. Get help right away if:  You develop numbness, tingling,  weakness, or problems with the use of your arms or legs.  You develop severe back pain that is not controlled with medicine.  You have a sudden change in bowel or bladder control.  You develop shortness of breath, dizziness, or you faint.  You develop nausea, vomiting, or sweating.  You have back pain that is a rhythmic, cramping pain similar to labor pains. Labor pain is usually 1-2 minutes apart, lasts for about 1 minute, and involves a bearing down feeling or pressure in your pelvis.  You have back pain and your water breaks or you have vaginal bleeding.  You have back pain or numbness that travels down your leg.  Your back pain developed after you fell.  You develop pain on one side of your back.  You see blood in your urine.  You develop skin blisters in the area of your back pain. This information is not intended to replace advice given to you by your health care provider. Make sure you discuss any  questions you have with your health care provider. Document Released: 05/18/2005 Document Revised: 07/16/2015 Document Reviewed: 10/22/2014 Elsevier Interactive Patient Education  2018 [place] of Pregnancy The second trimester is from week 13 through week 28, month 4 through 6. This is often the time in pregnancy that you feel your best. Often times, morning sickness has lessened or quit. You may have more energy, and you may get hungry more often. Your unborn baby (fetus) is growing rapidly. At the end of the sixth month, he or she is about 9 inches long and weighs about 1 pounds. You will likely feel the baby move (quickening) between 18 and 20 weeks of pregnancy. Follow these instructions at home:  Avoid all smoking, herbs, and alcohol. Avoid drugs not approved by your doctor.  Do not use any tobacco products, including cigarettes, chewing tobacco, and electronic cigarettes. If you need help quitting, ask your doctor. You may get counseling or other support  to help you quit.  Only take medicine as told by your doctor. Some medicines are safe and some are not during pregnancy.  Exercise only as told by your doctor. Stop exercising if you start having cramps.  Eat regular, healthy meals.  Wear a good support bra if your breasts are tender.  Do not use hot tubs, steam rooms, or saunas.  Wear your seat belt when driving.  Avoid raw meat, uncooked cheese, and liter boxes and soil used by cats.  Take your prenatal vitamins.  Take 1500-2000 milligrams of calcium daily starting at the 20th week of pregnancy until you deliver your baby.  Try taking medicine that helps you poop (stool softener) as needed, and if your doctor approves. Eat more fiber by eating fresh fruit, vegetables, and whole grains. Drink enough fluids to keep your pee (urine) clear or pale yellow.  Take warm water baths (sitz baths) to soothe pain or discomfort caused by hemorrhoids. Use hemorrhoid cream if your doctor approves.  If you have puffy, bulging veins (varicose veins), wear support hose. Raise (elevate) your feet for 15 minutes, 3-4 times a day. Limit salt in your diet.  Avoid heavy lifting, wear low heals, and sit up straight.  Rest with your legs raised if you have leg cramps or low back pain.  Visit your dentist if you have not gone during your pregnancy. Use a soft toothbrush to brush your teeth. Be gentle when you floss.  You can have sex (intercourse) unless your doctor tells you not to.  Go to your doctor visits. Get help if:  You feel dizzy.  You have mild cramps or pressure in your lower belly (abdomen).  You have a nagging pain in your belly area.  You continue to feel sick to your stomach (nauseous), throw up (vomit), or have watery poop (diarrhea).  You have bad smelling fluid coming from your vagina.  You have pain with peeing (urination). Get help right away if:  You have a fever.  You are leaking fluid from your vagina.  You have  spotting or bleeding from your vagina.  You have severe belly cramping or pain.  You lose or gain weight rapidly.  You have trouble catching your breath and have chest pain.  You notice sudden or extreme puffiness (swelling) of your face, hands, ankles, feet, or legs.  You have not felt the baby move in over an hour.  You have severe headaches that do not go away with medicine.  You have vision changes. This information  is not intended to replace advice given to you by your health care provider. Make sure you discuss any questions you have with your health care provider. Document Released: 05/04/2009 Document Revised: 07/16/2015 Document Reviewed: 04/10/2012 Elsevier Interactive Patient Education  2017 [place].   Common Medications Safe in Pregnancy  Acne:      Constipation:  Benzoyl Peroxide     Colace  Clindamycin      Dulcolax Suppository  Topica Erythromycin     Fibercon  Salicylic Acid      Metamucil         Miralax AVOID:        Senakot   Accutane    Cough:  Retin-A       Cough Drops  Tetracycline      Phenergan w/ Codeine if Rx  Minocycline      Robitussin (Plain & DM)  Antibiotics:     Crabs/Lice:  Ceclor       RID  Cephalosporins    AVOID:  E-Mycins      Kwell  Keflex  Macrobid/Macrodantin   Diarrhea:  Penicillin      Kao-Pectate  Zithromax      Imodium AD         PUSH FLUIDS AVOID:       Cipro     Fever:  Tetracycline      Tylenol (Regular or Extra  Minocycline       Strength)  Levaquin      Extra Strength-Do not          Exceed 8 tabs/24 hrs Caffeine:        <213m/day (equiv. To 1 cup of coffee or  approx. 3 12 oz sodas)         Gas: Cold/Hayfever:       Gas-X  Benadryl      Mylicon  Claritin       Phazyme  **Claritin-D        Chlor-Trimeton    Headaches:  Dimetapp      ASA-Free Excedrin  Drixoral-Non-Drowsy     Cold Compress  Mucinex (Guaifenasin)     Tylenol (Regular or Extra  Sudafed/Sudafed-12 Hour     Strength)  **Sudafed PE  Pseudoephedrine   Tylenol Cold & Sinus     Vicks Vapor Rub  Zyrtec  **AVOID if Problems With Blood Pressure         Heartburn: Avoid lying down for at least 1 hour after meals  Aciphex      Maalox     Rash:  Milk of Magnesia     Benadryl    Mylanta       1% Hydrocortisone Cream  Pepcid  Pepcid Complete   Sleep Aids:  Prevacid      Ambien   Prilosec       Benadryl  Rolaids       Chamomile Tea  Tums (Limit 4/day)     Unisom          Tylenol PM         Warm milk-add vanilla or  Hemorrhoids:       Sugar for taste  Anusol/Anusol H.C.  (RX: Analapram 2.5%)  Sugar Substitutes:  Hydrocortisone OTC     Ok in moderation  Preparation H      Tucks        Vaseline lotion applied to tissue with wiping    Herpes:     Throat:  Acyclovir      Oragel  Famvir  Valtrex     Vaccines:         Flu Shot Leg Cramps:       *Gardasil  Benadryl      Hepatitis A         Hepatitis B Nasal Spray:       Pneumovax  Saline Nasal Spray     Polio Booster         Tetanus Nausea:       Tuberculosis test or PPD  Vitamin B6 25 mg TID   AVOID:    Dramamine      *Gardasil  Emetrol       Live Poliovirus  Ginger Root 250 mg QID    MMR (measles, mumps &  High Complex Carbs @ Bedtime    rebella)  Sea Bands-Accupressure    Varicella (Chickenpox)  Unisom 1/2 tab TID     *No known complications           If received before Pain:         Known pregnancy;   Darvocet       Resume series after  Lortab        Delivery  Percocet    Yeast:   Tramadol      Femstat  Tylenol 3      Gyne-lotrimin  Ultram       Monistat  Vicodin           MISC:         All Sunscreens           Hair Coloring/highlights          Insect Repellant's          (Including DEET)         [place] Eating Plan for Pregnant Women While you are pregnant, your body will require additional nutrition to help support your growing baby. It is recommended that you consume:  150 additional calories each day during your first  trimester.  300 additional calories each day during your second trimester.  300 additional calories each day during your third trimester.  Eating a healthy, well-balanced diet is very important for your health and for your baby's health. You also have a higher need for some vitamins and minerals, such as folic acid, calcium, iron, and vitamin D. What do I need to know about eating during pregnancy?  Do not try to lose weight or go on a diet during pregnancy.  Choose healthy, nutritious foods. Choose  of a sandwich with a glass of milk instead of a candy bar or a high-calorie sugar-sweetened beverage.  Limit your overall intake of foods that have "empty calories." These are foods that have little nutritional value, such as sweets, desserts, candies, sugar-sweetened beverages, and fried foods.  Eat a variety of foods, especially fruits and vegetables.  Take a prenatal vitamin to help meet the additional needs during pregnancy, specifically for folic acid, iron, calcium, and vitamin D.  Remember to stay active. Ask your health care provider for exercise recommendations that are specific to you.  Practice good food safety and cleanliness, such as washing your hands before you eat and after you prepare raw meat. This helps to prevent foodborne illnesses, such as listeriosis, that can be very dangerous for your baby. Ask your health care provider for more information about listeriosis. What does 150 extra calories look like? Healthy options for an additional 150 calories each day could be any of the following:  Plain low-fat yogurt (6-8 oz)  with  cup of berries.  1 apple with 2 teaspoons of peanut butter.  Cut-up vegetables with  cup of hummus.  Low-fat chocolate milk (8 oz or 1 cup).  1 string cheese with 1 medium orange.   of a peanut butter and jelly sandwich on whole-wheat bread (1 tsp of peanut butter).  For 300 calories, you could eat two of those healthy options each  day. What is a healthy amount of weight to gain? The recommended amount of weight for you to gain is based on your pre-pregnancy BMI. If your pre-pregnancy BMI was:  Less than 18 (underweight), you should gain 28-40 lb.  18-24.9 (normal), you should gain 25-35 lb.  25-29.9 (overweight), you should gain 15-25 lb.  Greater than 30 (obese), you should gain 11-20 lb.  What if I am having twins or multiples? Generally, pregnant women who will be having twins or multiples may need to increase their daily calories by 300-600 calories each day. The recommended range for total weight gain is 25-54 lb, depending on your pre-pregnancy BMI. Talk with your health care provider for specific guidance about additional nutritional needs, weight gain, and exercise during your pregnancy. What foods can I eat? Grains Any grains. Try to choose whole grains, such as whole-wheat bread, oatmeal, or brown rice. Vegetables Any vegetables. Try to eat a variety of colors and types of vegetables to get a full range of vitamins and minerals. Remember to wash your vegetables well before eating. Fruits Any fruits. Try to eat a variety of colors and types of fruit to get a full range of vitamins and minerals. Remember to wash your fruits well before eating. Meats and Other Protein Sources Lean meats, including chicken, [place], fish, and lean cuts of beef, veal, or pork. Make sure that all meats are cooked to "well done." Tofu. Tempeh. Beans. Eggs. Peanut butter and other nut butters. Seafood, such as shrimp, crab, and lobster. If you choose fish, select types that are higher in omega-3 fatty acids, including salmon, herring, mussels, trout, sardines, and pollock. Make sure that all meats are cooked to food-safe temperatures. Dairy Pasteurized milk and milk alternatives. Pasteurized yogurt and pasteurized cheese. Cottage cheese. Sour cream. Beverages Water. Juices that contain 100% fruit juice or vegetable juice.  Caffeine-free teas and decaffeinated coffee. Drinks that contain caffeine are okay to drink, but it is better to avoid caffeine. Keep your total caffeine intake to less than 200 mg each day (12 oz of coffee, tea, or soda) or as directed by your health care provider. Condiments Any pasteurized condiments. Sweets and Desserts Any sweets and desserts. Fats and Oils Any fats and oils. The items listed above may not be a complete list of recommended foods or beverages. Contact your dietitian for more options. What foods are not recommended? Vegetables Unpasteurized (raw) vegetable juices. Fruits Unpasteurized (raw) fruit juices. Meats and Other Protein Sources Cured meats that have nitrates, such as bacon, salami, and hotdogs. Luncheon meats, bologna, or other deli meats (unless they are reheated until they are steaming hot). Refrigerated pate, meat spreads from a meat counter, smoked seafood that is found in the refrigerated section of a store. Raw fish, such as sushi or sashimi. High mercury content fish, such as tilefish, shark, swordfish, and king mackerel. Raw meats, such as tuna or beef tartare. Undercooked meats and poultry. Make sure that all meats are cooked to food-safe temperatures. Dairy Unpasteurized (raw) milk and any foods that have raw milk in them. Soft cheeses, such as  feta, queso blanco, queso fresco, Brie, Camembert cheeses, blue-veined cheeses, and Panela cheese (unless it is made with pasteurized milk, which must be stated on the label). Beverages Alcohol. Sugar-sweetened beverages, such as sodas, teas, or energy drinks. Condiments Homemade fermented foods and drinks, such as pickles, sauerkraut, or kombucha drinks. (Store-bought pasteurized versions of these are okay.) Other Salads that are made in the store, such as ham salad, chicken salad, egg salad, tuna salad, and seafood salad. The items listed above may not be a complete list of foods and beverages to avoid. Contact  your dietitian for more information. This information is not intended to replace advice given to you by your health care provider. Make sure you discuss any questions you have with your health care provider. Document Released: 11/22/2013 Document Revised: 07/16/2015 Document Reviewed: 07/23/2013 Elsevier Interactive Patient Education  2018 [place]. WHAT OB PATIENTS CAN EXPECT   Confirmation of pregnancy and ultrasound ordered if medically indicated-[redacted] weeks gestation  New OB (NOB) intake with nurse and New OB (NOB) labs- [redacted] weeks gestation  New OB (NOB) physical examination with provider- 11/[redacted] weeks gestation  Flu vaccine-[redacted] weeks gestation  Anatomy scan-[redacted] weeks gestation  Glucose tolerance test, blood work to test for anemia, T-dap vaccine-[redacted] weeks gestation  Vaginal swabs/cultures-STD/Group B strep-[redacted] weeks gestation  Appointments every 4 weeks until 28 weeks  Every 2 weeks from 28 weeks until 36 weeks  Weekly visits from 36 weeks until delivery

## 2018-01-15 NOTE — Progress Notes (Signed)
Patient here for NOB PE, no complaints.   

## 2018-01-15 NOTE — Progress Notes (Signed)
NEW OB HISTORY AND PHYSICAL  SUBJECTIVE:       Taylor Kirk is a 32 y.o. [id number] female, Patient's last menstrual period was 10/24/2017 (exact date)., Estimated Date of Delivery: 08/05/18, [redacted]w[redacted]d, presents today for establishment of Prenatal Care.  Endorses resolving nausea without vomiting, fatigue and intermittent breast tenderness.   Denies difficulty breathing or respiratory distress, chest pain, abdominal pain, vaginal bleeding, dysuria, and leg pain or swelling.    Gynecologic History  Patient's last menstrual period was 10/24/2017 (exact date).   Contraception: none  Last Pap: 09/2017. Results were: Negative/Negative  Obstetric History  OB History  Gravida Para Term Preterm AB Living  1 0 0 0 0 0  SAB TAB Ectopic Multiple Live Births  0 0 0 0 0    # Outcome Date GA Lbr Len/2nd Weight Sex Delivery Anes PTL Lv  1 Current             Past Medical History:  Diagnosis Date  . Genital herpes     No past surgical history on file.  Current Outpatient Medications on File Prior to Visit  Medication Sig Dispense Refill  . Prenatal Vit-Fe Fumarate-FA (PRENATAL MULTIVITAMIN) TABS tablet Take 1 tablet by mouth daily at 12 noon.    . valACYclovir (VALTREX) 500 MG tablet Take 1 tablet (500 mg total) by mouth daily. Can increase to twice a day for 5 days in the event of a recurrence 30 tablet 12   No current facility-administered medications on file prior to visit.     Allergies  Allergen Reactions  . Sulfa Antibiotics Rash    Social History   Socioeconomic History  . Marital status: Married    Spouse name: Not on file  . Number of children: Not on file  . Years of education: Not on file  . Highest education level: Not on file  Occupational History  . Occupation: Physical Therapist  Social Needs  . Financial resource strain: Not on file  . Food insecurity:    Worry: Not on file    Inability: Not on file  . Transportation needs:    Medical: Not on file   Non-medical: Not on file  Tobacco Use  . Smoking status: Never Smoker  . Smokeless tobacco: Never Used  Substance and Sexual Activity  . Alcohol use: Not Currently  . Drug use: Not Currently  . Sexual activity: Yes    Birth control/protection: None  Lifestyle  . Physical activity:    Days per week: Not on file    Minutes per session: Not on file  . Stress: Not on file  Relationships  . Social connections:    Talks on phone: Not on file    Gets together: Not on file    Attends religious service: Not on file    Active member of club or organization: Not on file    Attends meetings of clubs or organizations: Not on file    Relationship status: Married  . Intimate partner violence:    Fear of current or ex partner: Not on file    Emotionally abused: Not on file    Physically abused: Not on file    Forced sexual activity: Not on file  Other Topics Concern  . Not on file  Social History Narrative  . Not on file    Family History  Problem Relation Age of Onset  . Pancreatic cancer Paternal Grandfather   . Breast cancer Maternal Aunt   . Healthy Mother   .  Healthy Father     The following portions of the patient's history were reviewed and updated as appropriate: allergies, current medications, past OB history, past medical history, past surgical history, past family history, past social history, and problem list.   OBJECTIVE:  BP 136/85   Pulse 69   Wt 143 lb 9.6 oz (65.1 kg)   LMP 10/24/2017 (Exact Date)   BMI 23.90 kg/m   Initial Physical Exam (New OB)  GENERAL APPEARANCE: alert, well appearing, in no apparent distress  HEAD: normocephalic, atraumatic  MOUTH: mucous membranes moist, pharynx normal without lesions  THYROID: no thyromegaly or masses present  BREASTS: no masses noted, no significant tenderness, no palpable axillary nodes, no skin changes  LUNGS: clear to auscultation, no wheezes, rales or rhonchi, symmetric air entry  HEART: regular rate and  rhythm, no murmurs  ABDOMEN: soft, nontender, nondistended, no abnormal masses, no epigastric pain, fundus not palpable and FHT present  EXTREMITIES: no redness or tenderness in the calves or thighs, no edema  SKIN: normal coloration and turgor, no rashes; professional tattoos present  LYMPH NODES: no adenopathy palpable  NEUROLOGIC: alert, oriented, normal speech, no focal findings or movement disorder noted  PELVIC EXAM: not indicated  ASSESSMENT:  Normal pregnancy Declines genetic screening Desires midwifery care  PLAN: Prenatal care Encouraged Vitamin C during pregnancy for HSV prophylaxis New OB counseling: The patient has been given an overview regarding routine prenatal care. Recommendations regarding diet, weight gain, and exercise in pregnancy were given. Prenatal testing, optional genetic testing, and ultrasound use in pregnancy were reviewed.  Benefits of Breast Feeding were discussed. The patient is encouraged to consider nursing her baby post partum. See orders   Gunnar BullaJenkins Michelle Kenyona Rena, CNM [facility], [facility] 01/15/18 9:32 AM

## 2018-02-12 ENCOUNTER — Ambulatory Visit (INDEPENDENT_AMBULATORY_CARE_PROVIDER_SITE_OTHER): Payer: 59 | Admitting: Certified Nurse Midwife

## 2018-02-12 VITALS — BP 131/83 | HR 72 | Wt 142.3 lb

## 2018-02-12 DIAGNOSIS — Z3401 Encounter for supervision of normal first pregnancy, first trimester: Secondary | ICD-10-CM

## 2018-02-12 LAB — POCT URINALYSIS DIPSTICK OB
BILIRUBIN UA: NEGATIVE
Blood, UA: NEGATIVE
Glucose, UA: NEGATIVE
Ketones, UA: NEGATIVE
Leukocytes, UA: NEGATIVE
Nitrite, UA: NEGATIVE
PH UA: 8.5 — AB (ref 5.0–8.0)
POC,PROTEIN,UA: NEGATIVE
Spec Grav, UA: 1.01 (ref 1.010–1.025)
UROBILINOGEN UA: 0.2 U/dL

## 2018-02-12 NOTE — Progress Notes (Signed)
ROB doing well. No complaints. Discussed round ligament pain. Pt verbalizes understanding. Anatomy scan and ROB in 4 wks with Melody.   Doreene BurkeAnnie Mickey Esguerra, CNM

## 2018-02-12 NOTE — Patient Instructions (Signed)

## 2018-02-21 NOTE — L&D Delivery Note (Signed)
Delivery Note  2212 In room to see patient, SVE: 10/100/+1, vertex per RN. Effective maternal pushing efforts noted.   Spontaneous vaginal birth of liveborn female infant at 2253 in direct occiput anterior position. Infant immediately to maternal abdomen. Delayed cord clamping, skin to skin, three (3) vessel cord. APGARS: 8, 9. Weight pending. Receiving nurse present at bedside for birth.   IV pitocin infusing. Large gush of blood noted. Avulsion of placental cord with gentle traction. RN preformed fundal massage during manual removal of intact placenta at 2253. Fundus firm. Rubra small. First degree perineal laceration repaired with 3-0 vicyrl rapide under adequate epidural anesthesia. Laceration hemostatic and well approximated. Vault check completed. Counts correct x 2. QBL: 415 ml.   Initiate routine postpartum care and orders. Mom to postpartum.  Baby to Couplet care / Skin to Skin.  FOB present at bedside and overjoyed with the birth of "Ace".    Gunnar Bulla, CNM [facility], [facility] 07/14/2018, 11:30 PM

## 2018-03-14 ENCOUNTER — Ambulatory Visit (INDEPENDENT_AMBULATORY_CARE_PROVIDER_SITE_OTHER): Payer: 59 | Admitting: Obstetrics and Gynecology

## 2018-03-14 ENCOUNTER — Ambulatory Visit (INDEPENDENT_AMBULATORY_CARE_PROVIDER_SITE_OTHER): Payer: 59

## 2018-03-14 DIAGNOSIS — Z3402 Encounter for supervision of normal first pregnancy, second trimester: Secondary | ICD-10-CM

## 2018-03-14 DIAGNOSIS — Z3401 Encounter for supervision of normal first pregnancy, first trimester: Secondary | ICD-10-CM | POA: Diagnosis not present

## 2018-03-14 NOTE — Progress Notes (Signed)
Indications:Anatomy Ultrasound Findings:  Singleton intrauterine pregnancy is visualized with FHR at 157 BPM. Biometrics give an (U/S) Gestational age of [redacted]w[redacted]d and an (U/S) EDD of 08/03/18; this correlates with the clinically established Estimated Date of Delivery: 08/05/18  Fetal presentation is Cephalic.  EFW: wnl. Placenta: fundal, mostly posterior. Grade: 0 AFI: subjectively normal.  Anatomic survey is incomplete for face , N/Lips and profile d/t fetal position and normal; Gender - surprise.    Right Ovary is normal in appearance. Left Ovary is normal appearance. Survey of the adnexa demonstrates no adnexal masses. There is no free peritoneal fluid in the cul de sac.  Impression: 1. [redacted]w[redacted]d Viable Singleton Intrauterine pregnancy by U/S. 2. (U/S) EDD is consistent with Clinically established Estimated Date of Delivery: 08/05/18 . 3. Normal Anatomy Scan

## 2018-03-29 ENCOUNTER — Ambulatory Visit (INDEPENDENT_AMBULATORY_CARE_PROVIDER_SITE_OTHER): Payer: 59

## 2018-03-29 DIAGNOSIS — Z3402 Encounter for supervision of normal first pregnancy, second trimester: Secondary | ICD-10-CM | POA: Diagnosis not present

## 2018-04-11 ENCOUNTER — Encounter: Payer: Self-pay | Admitting: Certified Nurse Midwife

## 2018-04-11 ENCOUNTER — Ambulatory Visit (INDEPENDENT_AMBULATORY_CARE_PROVIDER_SITE_OTHER): Payer: 59 | Admitting: Certified Nurse Midwife

## 2018-04-11 VITALS — BP 129/72 | HR 79 | Wt 153.4 lb

## 2018-04-11 DIAGNOSIS — Z23 Encounter for immunization: Secondary | ICD-10-CM | POA: Diagnosis not present

## 2018-04-11 DIAGNOSIS — Z3A21 21 weeks gestation of pregnancy: Secondary | ICD-10-CM

## 2018-04-11 DIAGNOSIS — Z3402 Encounter for supervision of normal first pregnancy, second trimester: Secondary | ICD-10-CM | POA: Diagnosis not present

## 2018-04-11 LAB — POCT URINALYSIS DIPSTICK OB
BILIRUBIN UA: NEGATIVE
Blood, UA: NEGATIVE
Glucose, UA: NEGATIVE
Ketones, UA: NEGATIVE
Leukocytes, UA: NEGATIVE
Nitrite, UA: NEGATIVE
POC,PROTEIN,UA: NEGATIVE
Spec Grav, UA: 1.01 (ref 1.010–1.025)
Urobilinogen, UA: 0.2 E.U./dL
pH, UA: 8 (ref 5.0–8.0)

## 2018-04-11 NOTE — Progress Notes (Signed)
ROB doing well. Feels good movement. Had complete of anatomy scan ( see below). Discussed glucose screen at next visit. Follow up 4 wks. With Melody.   Doreene Burke, CNM    Patient Name: Taylor Kirk DOB: 1985-11-06 MRN: [id number]  ULTRASOUND REPORT  Location: [facility] Date of Service: 03/29/2018   Indications: Anatomy follow up ultrasound Findings:  Mason Jim intrauterine pregnancy is visualized with FHR at 158 BPM.  Fetal presentation is Breech.  Placenta: posterior. Grade: 0 AFI: subjectively normal.  Anatomic survey is complete for face,profile Nose/Lips  There is no free peritoneal fluid in the cul de sac.  Impression: 1. [redacted]w[redacted]d Viable Singleton Intrauterine pregnancy previously established criteria. 2. Normal Anatomy Scan is now complete face,profile Nose/Lips  Recommendations: 1.Clinical correlation with the patient's History and Physical Exam.   Abeer Alsammarraie, RDMS   I have reviewed this study and agree with documented findings.    Hildred Laser, MD [facility]

## 2018-04-11 NOTE — Patient Instructions (Signed)

## 2018-05-09 ENCOUNTER — Ambulatory Visit (INDEPENDENT_AMBULATORY_CARE_PROVIDER_SITE_OTHER): Payer: 59 | Admitting: Obstetrics and Gynecology

## 2018-05-09 ENCOUNTER — Other Ambulatory Visit: Payer: Self-pay

## 2018-05-09 ENCOUNTER — Other Ambulatory Visit: Payer: 59

## 2018-05-09 VITALS — BP 117/74 | HR 81 | Wt 160.6 lb

## 2018-05-09 DIAGNOSIS — O26892 Other specified pregnancy related conditions, second trimester: Secondary | ICD-10-CM | POA: Diagnosis not present

## 2018-05-09 DIAGNOSIS — O26899 Other specified pregnancy related conditions, unspecified trimester: Secondary | ICD-10-CM

## 2018-05-09 DIAGNOSIS — Z3492 Encounter for supervision of normal pregnancy, unspecified, second trimester: Secondary | ICD-10-CM

## 2018-05-09 DIAGNOSIS — Z23 Encounter for immunization: Secondary | ICD-10-CM

## 2018-05-09 DIAGNOSIS — Z131 Encounter for screening for diabetes mellitus: Secondary | ICD-10-CM

## 2018-05-09 DIAGNOSIS — R12 Heartburn: Secondary | ICD-10-CM | POA: Diagnosis not present

## 2018-05-09 DIAGNOSIS — Z130 Encounter for screening for diseases of the blood and blood-forming organs and certain disorders involving the immune mechanism: Secondary | ICD-10-CM

## 2018-05-09 LAB — POCT URINALYSIS DIPSTICK OB
Bilirubin, UA: NEGATIVE
Blood, UA: NEGATIVE
GLUCOSE, UA: NEGATIVE
Ketones, UA: NEGATIVE
Leukocytes, UA: NEGATIVE
Nitrite, UA: NEGATIVE
POC,PROTEIN,UA: NEGATIVE
Spec Grav, UA: 1.01 (ref 1.010–1.025)
Urobilinogen, UA: 0.2 E.U./dL
pH, UA: 8 (ref 5.0–8.0)

## 2018-05-09 MED ORDER — TETANUS-DIPHTH-ACELL PERTUSSIS 5-2.5-18.5 LF-MCG/0.5 IM SUSP
0.5000 mL | Freq: Once | INTRAMUSCULAR | Status: AC
  Administered 2018-05-09: 0.5 mL via INTRAMUSCULAR

## 2018-05-09 MED ORDER — TETANUS-DIPHTH-ACELL PERTUSSIS 5-2.5-18.5 LF-MCG/0.5 IM SUSP: 0.5000 mL | Freq: Once | INTRAMUSCULAR | Status: DC

## 2018-05-09 NOTE — Progress Notes (Signed)
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

## 2018-05-09 NOTE — Patient Instructions (Addendum)
Third Trimester of Pregnancy The third trimester is from week 28 through week 40 (months 7 through 9). The third trimester is a time when the unborn baby (fetus) is growing rapidly. At the end of the ninth month, the fetus is about 20 inches in length and weighs 6-10 pounds. Body changes during your third trimester Your body will continue to go through many changes during pregnancy. The changes vary from woman to woman. During the third trimester:  Your weight will continue to increase. You can expect to gain 25-35 pounds (11-16 kg) by the end of the pregnancy.  You may begin to get stretch marks on your hips, abdomen, and breasts.  You may urinate more often because the fetus is moving lower into your pelvis and pressing on your bladder.  You may develop or continue to have heartburn. This is caused by increased hormones that slow down muscles in the digestive tract.  You may develop or continue to have constipation because increased hormones slow digestion and cause the muscles that push waste through your intestines to relax.  You may develop hemorrhoids. These are swollen veins (varicose veins) in the rectum that can itch or be painful.  You may develop swollen, bulging veins (varicose veins) in your legs.  You may have increased body aches in the pelvis, back, or thighs. This is due to weight gain and increased hormones that are relaxing your joints.  You may have changes in your hair. These can include thickening of your hair, rapid growth, and changes in texture. Some women also have hair loss during or after pregnancy, or hair that feels dry or thin. Your hair will most likely return to normal after your baby is born.  Your breasts will continue to grow and they will continue to become tender. A yellow fluid (colostrum) may leak from your breasts. This is the first milk you are producing for your baby.  Your belly button may stick out.  You may notice more swelling in your hands,  face, or ankles.  You may have increased tingling or numbness in your hands, arms, and legs. The skin on your belly may also feel numb.  You may feel short of breath because of your expanding uterus.  You may have more problems sleeping. This can be caused by the size of your belly, increased need to urinate, and an increase in your body's metabolism.  You may notice the fetus "dropping," or moving lower in your abdomen (lightening).  You may have increased vaginal discharge.  You may notice your joints feel loose and you may have pain around your pelvic bone. What to expect at prenatal visits You will have prenatal exams every 2 weeks until week 36. Then you will have weekly prenatal exams. During a routine prenatal visit:  You will be weighed to make sure you and the baby are growing normally.  Your blood pressure will be taken.  Your abdomen will be measured to track your baby's growth.  The fetal heartbeat will be listened to.  Any test results from the previous visit will be discussed.  You may have a cervical check near your due date to see if your cervix has softened or thinned (effaced).  You will be tested for Group B streptococcus. This happens between 35 and 37 weeks. Your health care provider may ask you:  What your birth plan is.  How you are feeling.  If you are feeling the baby move.  If you have had any abnormal   symptoms, such as leaking fluid, bleeding, severe headaches, or abdominal cramping.  If you are using any tobacco products, including cigarettes, chewing tobacco, and electronic cigarettes.  If you have any questions. Other tests or screenings that may be performed during your third trimester include:  Blood tests that check for low iron levels (anemia).  Fetal testing to check the health, activity level, and growth of the fetus. Testing is done if you have certain medical conditions or if there are problems during the pregnancy.  Nonstress test  (NST). This test checks the health of your baby to make sure there are no signs of problems, such as the baby not getting enough oxygen. During this test, a belt is placed around your belly. The baby is made to move, and its heart rate is monitored during movement. What is false labor? False labor is a condition in which you feel small, irregular tightenings of the muscles in the womb (contractions) that usually go away with rest, changing position, or drinking water. These are called Braxton Hicks contractions. Contractions may last for hours, days, or even weeks before true labor sets in. If contractions come at regular intervals, become more frequent, increase in intensity, or become painful, you should see your health care provider. What are the signs of labor?  Abdominal cramps.  Regular contractions that start at 10 minutes apart and become stronger and more frequent with time.  Contractions that start on the top of the uterus and spread down to the lower abdomen and back.  Increased pelvic pressure and dull back pain.  A watery or bloody mucus discharge that comes from the vagina.  Leaking of amniotic fluid. This is also known as your "water breaking." It could be a slow trickle or a gush. Let your health care provider know if it has a color or strange odor. If you have any of these signs, call your health care provider right away, even if it is before your due date. Follow these instructions at home: Medicines  Follow your health care provider's instructions regarding medicine use. Specific medicines may be either safe or unsafe to take during pregnancy.  Take a prenatal vitamin that contains at least 600 micrograms (mcg) of folic acid.  If you develop constipation, try taking a stool softener if your health care provider approves. Eating and drinking   Eat a balanced diet that includes fresh fruits and vegetables, whole grains, good sources of protein such as meat, eggs, or tofu,  and low-fat dairy. Your health care provider will help you determine the amount of weight gain that is right for you.  Avoid raw meat and uncooked cheese. These carry germs that can cause birth defects in the baby.  If you have low calcium intake from food, talk to your health care provider about whether you should take a daily calcium supplement.  Eat four or five small meals rather than three large meals a day.  Limit foods that are high in fat and processed sugars, such as fried and sweet foods.  To prevent constipation: ? Drink enough fluid to keep your urine clear or pale yellow. ? Eat foods that are high in fiber, such as fresh fruits and vegetables, whole grains, and beans. Activity  Exercise only as directed by your health care provider. Most women can continue their usual exercise routine during pregnancy. Try to exercise for 30 minutes at least 5 days a week. Stop exercising if you experience uterine contractions.  Avoid heavy lifting.  Do   not exercise in extreme heat or humidity, or at high altitudes.  Wear low-heel, comfortable shoes.  Practice good posture.  You may continue to have sex unless your health care provider tells you otherwise. Relieving pain and discomfort  Take frequent breaks and rest with your legs elevated if you have leg cramps or low back pain.  Take warm sitz baths to soothe any pain or discomfort caused by hemorrhoids. Use hemorrhoid cream if your health care provider approves.  Wear a good support bra to prevent discomfort from breast tenderness.  If you develop varicose veins: ? Wear support pantyhose or compression stockings as told by your healthcare provider. ? Elevate your feet for 15 minutes, 3-4 times a day. Prenatal care  Write down your questions. Take them to your prenatal visits.  Keep all your prenatal visits as told by your health care provider. This is important. Safety  Wear your seat belt at all times when driving.  Make  a list of emergency phone numbers, including numbers for family, friends, the hospital, and police and fire departments. General instructions  Avoid cat litter boxes and soil used by cats. These carry germs that can cause birth defects in the baby. If you have a cat, ask someone to clean the litter box for you.  Do not travel far distances unless it is absolutely necessary and only with the approval of your health care provider.  Do not use hot tubs, steam rooms, or saunas.  Do not drink alcohol.  Do not use any products that contain nicotine or tobacco, such as cigarettes and e-cigarettes. If you need help quitting, ask your health care provider.  Do not use any medicinal herbs or unprescribed drugs. These chemicals affect the formation and growth of the baby.  Do not douche or use tampons or scented sanitary pads.  Do not cross your legs for long periods of time.  To prepare for the arrival of your baby: ? Take prenatal classes to understand, practice, and ask questions about labor and delivery. ? Make a trial run to the hospital. ? Visit the hospital and tour the maternity area. ? Arrange for maternity or paternity leave through employers. ? Arrange for family and friends to take care of pets while you are in the hospital. ? Purchase a rear-facing car seat and make sure you know how to install it in your car. ? Pack your hospital bag. ? Prepare the baby's nursery. Make sure to remove all pillows and stuffed animals from the baby's crib to prevent suffocation.  Visit your dentist if you have not gone during your pregnancy. Use a soft toothbrush to brush your teeth and be gentle when you floss. Contact a health care provider if:  You are unsure if you are in labor or if your water has broken.  You become dizzy.  You have mild pelvic cramps, pelvic pressure, or nagging pain in your abdominal area.  You have lower back pain.  You have persistent nausea, vomiting, or  diarrhea.  You have an unusual or bad smelling vaginal discharge.  You have pain when you urinate. Get help right away if:  Your water breaks before 37 weeks.  You have regular contractions less than 5 minutes apart before 37 weeks.  You have a fever.  You are leaking fluid from your vagina.  You have spotting or bleeding from your vagina.  You have severe abdominal pain or cramping.  You have rapid weight loss or weight gain.  You have   shortness of breath with chest pain.  You notice sudden or extreme swelling of your face, hands, ankles, feet, or legs.  Your baby makes fewer than 10 movements in 2 hours.  You have severe headaches that do not go away when you take medicine.  You have vision changes. Summary  The third trimester is from week 28 through week 40, months 7 through 9. The third trimester is a time when the unborn baby (fetus) is growing rapidly.  During the third trimester, your discomfort may increase as you and your baby continue to gain weight. You may have abdominal, leg, and back pain, sleeping problems, and an increased need to urinate.  During the third trimester your breasts will keep growing and they will continue to become tender. A yellow fluid (colostrum) may leak from your breasts. This is the first milk you are producing for your baby.  False labor is a condition in which you feel small, irregular tightenings of the muscles in the womb (contractions) that eventually go away. These are called Braxton Hicks contractions. Contractions may last for hours, days, or even weeks before true labor sets in.  Signs of labor can include: abdominal cramps; regular contractions that start at 10 minutes apart and become stronger and more frequent with time; watery or bloody mucus discharge that comes from the vagina; increased pelvic pressure and dull back pain; and leaking of amniotic fluid. This information is not intended to replace advice given to you by your  health care provider. Make sure you discuss any questions you have with your health care provider. Document Released: 02/01/2001 Document Revised: 03/15/2016 Document Reviewed: 03/15/2016 Elsevier Interactive Patient Education  2019 [employer]  Heartburn During Pregnancy Heartburn is a type of pain or discomfort that can happen in the throat or chest. It is often described as a burning sensation. Heartburn is common during pregnancy because:  A hormone (progesterone) that is released during pregnancy may relax the valve (lower esophageal sphincter, or LES) that separates the esophagus from the stomach. This allows stomach acid to move up into the esophagus, causing heartburn.  The uterus gets larger and pushes up on the stomach, which pushes more acid into the esophagus. This is especially true in the later stages of pregnancy. Heartburn usually goes away or gets better after giving birth. What are the causes? Heartburn is caused by stomach acid backing up into the esophagus (reflux). Reflux can be triggered by:  Changing hormone levels.  Large meals.  Certain foods and beverages, such as coffee, chocolate, onions, and peppermint.  Exercise.  Increased stomach acid production. What increases the risk? You are more likely to experience heartburn during pregnancy if you:  Had heartburn prior to becoming pregnant.  Have been pregnant more than once before.  Are overweight or obese. The likelihood that you will get heartburn also increases as you get farther along in your pregnancy, especially during the last trimester. What are the signs or symptoms? Symptoms of this condition include:  Burning pain in the chest or lower throat.  Bitter taste in the mouth.  Coughing.  Problems swallowing.  Vomiting.  Hoarse voice.  Asthma. Symptoms may get worse when you lie down or bend over. Symptoms are often worse at night. How is this diagnosed? This condition is diagnosed  based on:  Your medical history.  Your symptoms.  Blood tests to check for a certain type of bacteria associated with heartburn.  Whether taking heartburn medicine relieves your symptoms.  Examination of   the stomach and esophagus using a tube with a light and camera on the end (endoscopy). How is this treated? Treatment varies depending on how severe your symptoms are. Your health care provider may recommend:  Over-the-counter medicines (antacids or acid reducers) for mild heartburn.  Prescription medicines to decrease stomach acid or to protect your stomach lining.  Certain changes in your diet.  Raising the head of your bed so it is higher than the foot of the bed. This helps prevent stomach acid from backing up into the esophagus when you are lying down. Follow these instructions at home: Eating and drinking  Do not drink alcohol during your pregnancy.  Identify foods and beverages that make your symptoms worse, and avoid them.  Beverages that you may want to avoid include: ? Coffee and tea (with or without caffeine). ? Energy drinks and sports drinks. ? Carbonated drinks or sodas. ? Citrus fruit juices.  Foods that you may want to avoid include: ? Chocolate and cocoa. ? Peppermint and mint flavorings. ? Garlic, onions, and horseradish. ? Spicy and acidic foods, including peppers, chili powder, curry powder, vinegar, hot sauces, and barbecue sauce. ? Citrus fruits, such as oranges, lemons, and limes. ? Tomato-based foods, such as red sauce, chili, and salsa. ? Fried and fatty foods, such as donuts, french fries, potato chips, and high-fat dressings. ? High-fat meats, such as hot dogs, cold cuts, sausage, ham, and bacon. ? High-fat dairy items, such as whole milk, butter, and cheese.  Eat small, frequent meals instead of large meals.  Avoid drinking large amounts of liquid with your meals.  Avoid eating meals during the 2-3 hours before bedtime.  Avoid lying down  right after you eat.  Do not exercise right after you eat. Medicines  Take over-the-counter and prescription medicines only as told by your health care provider.  Do not take aspirin, ibuprofen, or other NSAIDs unless your health care provider tells you to do that.  You may be instructed to avoid medicines that contain sodium bicarbonate. General instructions   If directed, raise the head of your bed about 6 inches (15 cm) by putting blocks under the legs. Sleeping with more pillows does not effectively relieve heartburn because it only changes the position of your head.  Do not use any products that contain nicotine or tobacco, such as cigarettes and e-cigarettes. If you need help quitting, ask your health care provider.  Wear loose-fitting clothing.  Try to reduce your stress, such as with yoga or meditation. If you need help managing stress, ask your health care provider.  Maintain a healthy weight. If you are overweight, work with your health care provider to safely lose weight.  Keep all follow-up visits as told by your health care provider. This is important. Contact a health care provider if:  You develop new symptoms.  Your symptoms do not improve with treatment.  You have unexplained weight loss.  You have difficulty swallowing.  You make loud sounds when you breathe (wheeze).  You have a cough that does not go away.  You have frequent heartburn for more than 2 weeks.  You have nausea or vomiting that does not get better with treatment.  You have pain in your abdomen. Get help right away if:  You have severe chest pain that spreads to your arm, neck, or jaw.  You feel sweaty, dizzy, or light-headed.  You have shortness of breath.  You have pain when swallowing.  You vomit, and your vomit looks   like blood or coffee grounds.  Your stool is bloody or black. This information is not intended to replace advice given to you by your health care provider. Make  sure you discuss any questions you have with your health care provider. Document Released: 02/05/2000 Document Revised: 10/26/2015 Document Reviewed: 10/26/2015 Elsevier Interactive Patient Education  2019 Elsevier Inc.  

## 2018-05-09 NOTE — Progress Notes (Signed)
ROB- glucola done, blood consent signed, tdap given, pt is doing well 

## 2018-05-11 LAB — GLUCOSE, 1 HOUR GESTATIONAL: Gestational Diabetes Screen: 118 mg/dL (ref 65–139)

## 2018-05-11 LAB — CBC
Hematocrit: 35.3 % (ref 34.0–46.6)
Hemoglobin: 12.1 g/dL (ref 11.1–15.9)
MCH: 31.3 pg (ref 26.6–33.0)
MCHC: 34.3 g/dL (ref 31.5–35.7)
MCV: 91 fL (ref 79–97)
Platelets: 205 10*3/uL (ref 150–450)
RBC: 3.87 x10E6/uL (ref 3.77–5.28)
RDW: 12.2 % (ref 11.7–15.4)
WBC: 9.6 10*3/uL (ref 3.4–10.8)

## 2018-05-11 LAB — RPR, QUANT+TP ABS (REFLEX)
Rapid Plasma Reagin, Quant: 1:8 {titer} — ABNORMAL HIGH
T Pallidum Abs: NONREACTIVE

## 2018-05-11 LAB — RPR: RPR Ser Ql: REACTIVE — AB

## 2018-05-16 ENCOUNTER — Other Ambulatory Visit: Payer: Self-pay | Admitting: Obstetrics and Gynecology

## 2018-05-17 NOTE — Progress Notes (Signed)
A couple of things could be going on here:   1) Has she ever had the diagnosis of syphilis in the past? Sometimes this can be cause of it and even if it was treated, it can still cause positive RPR levels with changing titers.  2) Could she possibly have early syphilis where testing is still laging?  UpToDate recommends performing one of the other treponemal tests to see if that one is positive or negative.  If negative, nothing more needs to be done and it is likely a false positive; if positive result then treat. Here is a list of the alternatives to the Treponemal ABS... - Microhemagglutination test for antibodies to T. pallidum (MHA-TP) - T. pallidum particle agglutination assay (TPPA) - T. pallidum enzyme immunoassay (TP-EIA) - Chemiluminescence immunoassay (CIA)   Hope this helps.   Dr. Valentino Saxon

## 2018-05-22 ENCOUNTER — Other Ambulatory Visit: Payer: Self-pay | Admitting: Certified Nurse Midwife

## 2018-05-22 ENCOUNTER — Encounter: Payer: 59 | Admitting: Certified Nurse Midwife

## 2018-05-22 ENCOUNTER — Telehealth: Payer: Self-pay

## 2018-05-22 NOTE — Telephone Encounter (Signed)
Coronavirus (COVID-19) Are you at risk?  Are you at risk for the Coronavirus (COVID-19)?  To be considered HIGH RISK for Coronavirus (COVID-19), you have to meet the following criteria:  . Traveled to [place], [place], [place], [place] or [place]; or in the [place] to [place], [place], [place], or [place]; and have fever, cough, and shortness of breath within the last 2 weeks of travel OR . Been in close contact with a person diagnosed with COVID-19 within the last 2 weeks and have fever, cough, and shortness of breath . IF YOU DO NOT MEET THESE CRITERIA, YOU ARE CONSIDERED LOW RISK FOR COVID-19.  What to do if you are HIGH RISK for COVID-19?  . If you are having a medical emergency, call 911. . Seek medical care right away. Before you go to a doctor's office, urgent care or emergency department, call ahead and tell them about your recent travel, contact with someone diagnosed with COVID-19, and your symptoms. You should receive instructions from your physician's office regarding next steps of care.  . When you arrive at healthcare provider, tell the healthcare staff immediately you have returned from visiting [place], [place], [place], [place] or [place]; or traveled in the [place] to [place], [place], [place], or [place]; in the last two weeks or you have been in close contact with a person diagnosed with COVID-19 in the last 2 weeks.   . Tell the health care staff about your symptoms: fever, cough and shortness of breath. . After you have been seen by a medical provider, you will be either: o Tested for (COVID-19) and discharged home on quarantine except to seek medical care if symptoms worsen, and asked to  - Stay home and avoid contact with others until you get your results (4-5 days)  - Avoid travel on public transportation if possible (such as bus, train, or airplane) or o Sent to the Emergency Department by EMS for evaluation, COVID-19 testing, and possible  admission depending on your condition and test results.  What to do if you are LOW RISK for COVID-19?  Reduce your risk of any infection by using the same precautions used for avoiding the common cold or flu:  . Wash your hands often with soap and warm water for at least 20 seconds.  If soap and water are not readily available, use an alcohol-based hand sanitizer with at least 60% alcohol.  . If coughing or sneezing, cover your mouth and nose by coughing or sneezing into the elbow areas of your shirt or coat, into a tissue or into your sleeve (not your hands). . Avoid shaking hands with others and consider head nods or verbal greetings only. . Avoid touching your eyes, nose, or mouth with unwashed hands.  . Avoid close contact with people who are sick. . Avoid places or events with large numbers of people in one location, like concerts or sporting events. . Carefully consider travel plans you have or are making. . If you are planning any travel outside or inside the [place], visit the CDC's Travelers' Health webpage for the latest health notices. . If you have some symptoms but not all symptoms, continue to monitor at home and seek medical attention if your symptoms worsen. . If you are having a medical emergency, call 911.   ADDITIONAL HEALTHCARE OPTIONS FOR PATIENTS  [facility] / e-Visit: [email or web address]         [facility]: [phone]  [facility]: [phone]                   [facility]: [phone]   Prescreened- neg. cm  

## 2018-05-22 NOTE — Progress Notes (Signed)
Ok. Thanks!

## 2018-05-23 ENCOUNTER — Other Ambulatory Visit: Payer: 59

## 2018-06-07 ENCOUNTER — Ambulatory Visit (INDEPENDENT_AMBULATORY_CARE_PROVIDER_SITE_OTHER): Payer: 59 | Admitting: Certified Nurse Midwife

## 2018-06-07 ENCOUNTER — Other Ambulatory Visit: Payer: Self-pay

## 2018-06-07 VITALS — BP 138/86 | HR 80 | Wt 170.4 lb

## 2018-06-07 DIAGNOSIS — Z0289 Encounter for other administrative examinations: Secondary | ICD-10-CM

## 2018-06-07 DIAGNOSIS — Z3493 Encounter for supervision of normal pregnancy, unspecified, third trimester: Secondary | ICD-10-CM

## 2018-06-07 LAB — POCT URINALYSIS DIPSTICK OB
Bilirubin, UA: NEGATIVE
Blood, UA: NEGATIVE
Glucose, UA: NEGATIVE
Ketones, UA: NEGATIVE
Leukocytes, UA: NEGATIVE
Nitrite, UA: NEGATIVE
POC,PROTEIN,UA: NEGATIVE
Spec Grav, UA: 1.015 (ref 1.010–1.025)
Urobilinogen, UA: 0.2 E.U./dL
pH, UA: 5 (ref 5.0–8.0)

## 2018-06-07 NOTE — Progress Notes (Signed)
ROB-Doing well. Third trimester handouts provided. 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 Plans epidural, breastfeeding, and circumcision. Anticipatory guidance regarding course of prenatal care. Reviewed red flag symptoms and when to call. RTC x 5 weeks for ROB or sooner if needed.

## 2018-06-07 NOTE — Patient Instructions (Signed)
[place], [place], [place] [address]  Phone: [phone]   [facility] (second location)  [address] [place], [place] [address]  Phone: [phone]   [facility]) [place], [place], [place] [address] Phone: [phone]   [address] [place], [place] [address]  Phone: [phone] Medications Safe in Pregnancy  Acne:      Constipation:  Benzoyl Peroxide     Colace  Clindamycin      Dulcolax Suppository  Topica Erythromycin     Fibercon  Salicylic Acid      Metamucil         Miralax AVOID:        Senakot   Accutane    Cough:  Retin-A       Cough Drops  Tetracycline      Phenergan w/ Codeine if Rx  Minocycline      Robitussin (Plain & DM)  Antibiotics:     Crabs/Lice:  Ceclor       RID  Cephalosporins    AVOID:  E-Mycins      Kwell  Keflex  Macrobid/Macrodantin   Diarrhea:  Penicillin      Kao-Pectate  Zithromax      Imodium AD         PUSH FLUIDS AVOID:       Cipro     Fever:  Tetracycline      Tylenol (Regular or Extra  Minocycline       Strength)  Levaquin      Extra Strength-Do not          Exceed 8 tabs/24 hrs Caffeine:        <284m/day (equiv. To 1 cup of coffee or  approx. 3 12 oz sodas)         Gas: Cold/Hayfever:       Gas-X  Benadryl      Mylicon  Claritin       Phazyme  **Claritin-D        Chlor-Trimeton    Headaches:  Dimetapp      ASA-Free Excedrin  Drixoral-Non-Drowsy     Cold Compress  Mucinex (Guaifenasin)     Tylenol (Regular or Extra  Sudafed/Sudafed-12 Hour     Strength)  **Sudafed PE Pseudoephedrine   Tylenol Cold & Sinus     Vicks Vapor Rub  Zyrtec  **AVOID if Problems With Blood Pressure         Heartburn: Avoid lying down for at least 1 hour after meals  Aciphex      Maalox     Rash:  Milk of Magnesia     Benadryl    Mylanta       1% Hydrocortisone Cream  Pepcid  Pepcid  Complete   Sleep Aids:  Prevacid      Ambien   Prilosec       Benadryl  Rolaids       Chamomile Tea  Tums (Limit 4/day)     Unisom  Zantac       Tylenol PM         Warm milk-add vanilla or  Hemorrhoids:       Sugar for taste  Anusol/Anusol H.C.  (RX: Analapram 2.5%)  Sugar Substitutes:  Hydrocortisone OTC     Ok in moderation  Preparation H      Tucks        Vaseline lotion applied to tissue  with wiping    Herpes:     Throat:  Acyclovir      Oragel  Famvir  Valtrex     Vaccines:         Flu Shot Leg Cramps:       *Gardasil  Benadryl      Hepatitis A         Hepatitis B Nasal Spray:       Pneumovax  Saline Nasal Spray     Polio Booster         Tetanus Nausea:       Tuberculosis test or PPD  Vitamin B6 25 mg TID   AVOID:    Dramamine      *Gardasil  Emetrol       Live Poliovirus  Ginger Root 250 mg QID    MMR (measles, mumps &  High Complex Carbs @ Bedtime    rebella)  Sea Bands-Accupressure    Varicella (Chickenpox)  Unisom 1/2 tab TID     *No known complications           If received before Pain:         Known pregnancy;   Darvocet       Resume series after  Lortab        Delivery  Percocet    Yeast:   Tramadol      Femstat  Tylenol 3      Gyne-lotrimin  Ultram       Monistat  Vicodin           MISC:         All Sunscreens           Hair Coloring/highlights          Insect Repellant's          (Including DEET)         Mystic Tans Third Trimester of Pregnancy  The third trimester is from week 28 through week 40 (months 7 through 9). This trimester is when your unborn baby (fetus) is growing very fast. At the end of the ninth month, the unborn baby is about 20 inches in length. It weighs about 6-10 pounds. Follow these instructions at home: Medicines  Take over-the-counter and prescription medicines only as told by your doctor. Some medicines are safe and some medicines are not safe during pregnancy.  Take a prenatal vitamin that contains at least 600 micrograms  (mcg) of folic acid.  If you have trouble pooping (constipation), take medicine that will make your stool soft (stool softener) if your doctor approves. Eating and drinking   Eat regular, healthy meals.  Avoid raw meat and uncooked cheese.  If you get low calcium from the food you eat, talk to your doctor about taking a daily calcium supplement.  Eat four or five small meals rather than three large meals a day.  Avoid foods that are high in fat and sugars, such as fried and sweet foods.  To prevent constipation: ? Eat foods that are high in fiber, like fresh fruits and vegetables, whole grains, and beans. ? Drink enough fluids to keep your pee (urine) clear or pale yellow. Activity  Exercise only as told by your doctor. Stop exercising if you start to have cramps.  Avoid heavy lifting, wear low heels, and sit up straight.  Do not exercise if it is too hot, too humid, or if you are in a place of great height (high altitude).  You may continue to have  sex unless your doctor tells you not to. Relieving pain and discomfort  Wear a good support bra if your breasts are tender.  Take frequent breaks and rest with your legs raised if you have leg cramps or low back pain.  Take warm water baths (sitz baths) to soothe pain or discomfort caused by hemorrhoids. Use hemorrhoid cream if your doctor approves.  If you develop puffy, bulging veins (varicose veins) in your legs: ? Wear support hose or compression stockings as told by your doctor. ? Raise (elevate) your feet for 15 minutes, 3-4 times a day. ? Limit salt in your food. Safety  Wear your seat belt when driving.  Make a list of emergency phone numbers, including numbers for family, friends, the hospital, and police and fire departments. Preparing for your baby's arrival To prepare for the arrival of your baby:  Take prenatal classes.  Practice driving to the hospital.  Visit the hospital and tour the maternity  area.  Talk to your work about taking leave once the baby comes.  Pack your hospital bag.  Prepare the baby's room.  Go to your doctor visits.  Buy a rear-facing car seat. Learn how to install it in your car. General instructions  Do not use hot tubs, steam rooms, or saunas.  Do not use any products that contain nicotine or tobacco, such as cigarettes and e-cigarettes. If you need help quitting, ask your doctor.  Do not drink alcohol.  Do not douche or use tampons or scented sanitary pads.  Do not cross your legs for long periods of time.  Do not travel for long distances unless you must. Only do so if your doctor says it is okay.  Visit your dentist if you have not gone during your pregnancy. Use a soft toothbrush to brush your teeth. Be gentle when you floss.  Avoid cat litter boxes and soil used by cats. These carry germs that can cause birth defects in the baby and can cause a loss of your baby (miscarriage) or stillbirth.  Keep all your prenatal visits as told by your doctor. This is important. Contact a doctor if:  You are not sure if you are in labor or if your water has broken.  You are dizzy.  You have mild cramps or pressure in your lower belly.  You have a nagging pain in your belly area.  You continue to feel sick to your stomach, you throw up, or you have watery poop.  You have bad smelling fluid coming from your vagina.  You have pain when you pee. Get help right away if:  You have a fever.  You are leaking fluid from your vagina.  You are spotting or bleeding from your vagina.  You have severe belly cramps or pain.  You lose or gain weight quickly.  You have trouble catching your breath and have chest pain.  You notice sudden or extreme puffiness (swelling) of your face, hands, ankles, feet, or legs.  You have not felt the baby move in over an hour.  You have severe headaches that do not go away with medicine.  You have trouble  seeing.  You are leaking, or you are having a gush of fluid, from your vagina before you are 37 weeks.  You have regular belly spasms (contractions) before you are 37 weeks. Summary  The third trimester is from week 28 through week 40 (months 7 through 9). This time is when your unborn baby is growing very fast.  Follow your doctor's advice about medicine, food, and activity.  Get ready for the arrival of your baby by taking prenatal classes, getting all the baby items ready, preparing the baby's room, and visiting your doctor to be checked.  Get help right away if you are bleeding from your vagina, or you have chest pain and trouble catching your breath, or if you have not felt your baby move in over an hour. This information is not intended to replace advice given to you by your health care provider. Make sure you discuss any questions you have with your health care provider. Document Released: 05/04/2009 Document Revised: 03/15/2016 Document Reviewed: 03/15/2016 Elsevier Interactive Patient Education  2019 [place]. Fetal Movement Counts Patient Name: ________________________________________________ Patient Due Date: ____________________ What is a fetal movement count?  A fetal movement count is the number of times that you feel your baby move during a certain amount of time. This may also be called a fetal kick count. A fetal movement count is recommended for every pregnant woman. You may be asked to start counting fetal movements as early as week 28 of your pregnancy. Pay attention to when your baby is most active. You may notice your baby's sleep and wake cycles. You may also notice things that make your baby move more. You should do a fetal movement count:  When your baby is normally most active.  At the same time each day. A good time to count movements is while you are resting, after having something to eat and drink. How do I count fetal movements? 1. Find a quiet,  comfortable area. Sit, or lie down on your side. 2. Write down the date, the start time and stop time, and the number of movements that you felt between those two times. Take this information with you to your health care visits. 3. For 2 hours, count kicks, flutters, swishes, rolls, and jabs. You should feel at least 10 movements during 2 hours. 4. You may stop counting after you have felt 10 movements. 5. If you do not feel 10 movements in 2 hours, have something to eat and drink. Then, keep resting and counting for 1 hour. If you feel at least 4 movements during that hour, you may stop counting. Contact a health care provider if:  You feel fewer than 4 movements in 2 hours.  Your baby is not moving like he or she usually does. Date: ____________ Start time: ____________ Stop time: ____________ Movements: ____________ Date: ____________ Start time: ____________ Stop time: ____________ Movements: ____________ Date: ____________ Start time: ____________ Stop time: ____________ Movements: ____________ Date: ____________ Start time: ____________ Stop time: ____________ Movements: ____________ Date: ____________ Start time: ____________ Stop time: ____________ Movements: ____________ Date: ____________ Start time: ____________ Stop time: ____________ Movements: ____________ Date: ____________ Start time: ____________ Stop time: ____________ Movements: ____________ Date: ____________ Start time: ____________ Stop time: ____________ Movements: ____________ Date: ____________ Start time: ____________ Stop time: ____________ Movements: ____________ This information is not intended to replace advice given to you by your health care provider. Make sure you discuss any questions you have with your health care provider. Document Released: 03/09/2006 Document Revised: 10/07/2015 Document Reviewed: 03/19/2015 Elsevier Interactive Patient Education  2019 [employer].

## 2018-07-10 ENCOUNTER — Telehealth: Payer: Self-pay

## 2018-07-10 NOTE — Telephone Encounter (Signed)
Coronavirus (COVID-19) Are you at risk?  Are you at risk for the Coronavirus (COVID-19)?  To be considered HIGH RISK for Coronavirus (COVID-19), you have to meet the following criteria:  . Traveled to [place], [place], [place], [place] or [place]; or in the [place] to [place], [place], [place], or [place]; and have fever, cough, and shortness of breath within the last 2 weeks of travel OR . Been in close contact with a person diagnosed with COVID-19 within the last 2 weeks and have fever, cough, and shortness of breath . IF YOU DO NOT MEET THESE CRITERIA, YOU ARE CONSIDERED LOW RISK FOR COVID-19.  What to do if you are HIGH RISK for COVID-19?  . If you are having a medical emergency, call 911. . Seek medical care right away. Before you go to a doctor's office, urgent care or emergency department, call ahead and tell them about your recent travel, contact with someone diagnosed with COVID-19, and your symptoms. You should receive instructions from your physician's office regarding next steps of care.  . When you arrive at healthcare provider, tell the healthcare staff immediately you have returned from visiting [place], [place], [place], [place] or [place]; or traveled in the [place] to [place], [place], [place], or [place]; in the last two weeks or you have been in close contact with a person diagnosed with COVID-19 in the last 2 weeks.   . Tell the health care staff about your symptoms: fever, cough and shortness of breath. . After you have been seen by a medical provider, you will be either: o Tested for (COVID-19) and discharged home on quarantine except to seek medical care if symptoms worsen, and asked to  - Stay home and avoid contact with others until you get your results (4-5 days)  - Avoid travel on public transportation if possible (such as bus, train, or airplane) or o Sent to the Emergency Department by EMS for evaluation, COVID-19 testing, and possible  admission depending on your condition and test results.  What to do if you are LOW RISK for COVID-19?  Reduce your risk of any infection by using the same precautions used for avoiding the common cold or flu:  . Wash your hands often with soap and warm water for at least 20 seconds.  If soap and water are not readily available, use an alcohol-based hand sanitizer with at least 60% alcohol.  . If coughing or sneezing, cover your mouth and nose by coughing or sneezing into the elbow areas of your shirt or coat, into a tissue or into your sleeve (not your hands). . Avoid shaking hands with others and consider head nods or verbal greetings only. . Avoid touching your eyes, nose, or mouth with unwashed hands.  . Avoid close contact with people who are sick. . Avoid places or events with large numbers of people in one location, like concerts or sporting events. . Carefully consider travel plans you have or are making. . If you are planning any travel outside or inside the [place], visit the CDC's Travelers' Health webpage for the latest health notices. . If you have some symptoms but not all symptoms, continue to monitor at home and seek medical attention if your symptoms worsen. . If you are having a medical emergency, call 911.   ADDITIONAL HEALTHCARE OPTIONS FOR PATIENTS  [facility] / e-Visit: [email or web address]         [facility]: [phone]     Urgent Care: [phone]                   [facility]: [phone]   Pre-screen negative, DM.   

## 2018-07-11 ENCOUNTER — Encounter: Payer: Self-pay | Admitting: Certified Nurse Midwife

## 2018-07-11 ENCOUNTER — Ambulatory Visit (INDEPENDENT_AMBULATORY_CARE_PROVIDER_SITE_OTHER): Payer: 59 | Admitting: Certified Nurse Midwife

## 2018-07-11 ENCOUNTER — Other Ambulatory Visit: Payer: Self-pay

## 2018-07-11 VITALS — BP 134/91 | HR 70 | Wt 180.4 lb

## 2018-07-11 DIAGNOSIS — Z3493 Encounter for supervision of normal pregnancy, unspecified, third trimester: Secondary | ICD-10-CM | POA: Diagnosis not present

## 2018-07-11 LAB — POCT URINALYSIS DIPSTICK OB
Bilirubin, UA: NEGATIVE
Blood, UA: NEGATIVE
Glucose, UA: NEGATIVE
Ketones, UA: NEGATIVE
Leukocytes, UA: NEGATIVE
Nitrite, UA: NEGATIVE
POC,PROTEIN,UA: NEGATIVE
Spec Grav, UA: 1.015 (ref 1.010–1.025)
Urobilinogen, UA: 0.2 E.U./dL
pH, UA: 6 (ref 5.0–8.0)

## 2018-07-11 MED ORDER — VALACYCLOVIR HCL 1 G PO TABS: 1000.0000 mg | ORAL_TABLET | Freq: Every day | ORAL | 1 refills | Status: DC

## 2018-07-11 NOTE — Progress Notes (Addendum)
ROB doing well. Feels good movement. GBS and cultures today. Pt has mild swelling , denies headache, epigastric pain or visual disturbances. Reflexes 1+ bilateral, no clonus. Valtrex start daily, order placed. Discussed signs & symptoms labor. Return in 1 wk.   Doreene Burke, CNM

## 2018-07-11 NOTE — Patient Instructions (Signed)
Group B Streptococcus Infection During Pregnancy  Group B Streptococcus (GBS) is a type of bacteria (Streptococcus agalactiae) that is often found in healthy people, commonly in the rectum, vagina, and intestines. In people who are healthy and not pregnant, the bacteria rarely cause serious illness or complications. However, women who test positive for GBS during pregnancy can pass the bacteria to their baby during childbirth, which can cause serious infection in the baby after birth. Women with GBS may also have infections during their pregnancy or immediately after childbirth, such as such as urinary tract infections (UTIs) or infections of the uterus (uterine infections). Having GBS also increases a woman's risk of complications during pregnancy, such as early (preterm) labor or delivery, miscarriage, or stillbirth. Routine testing (screening) for GBS is recommended for all pregnant women. What increases the risk? You may have a higher risk for GBS infection during pregnancy if you had one during a past pregnancy. What are the signs or symptoms? In most cases, GBS infection does not cause symptoms in pregnant women. Signs and symptoms of a possible GBS-related infection may include:  Labor starting before the 37th week of pregnancy.  A UTI or bladder infection, which may cause: ? Fever. ? Pain or burning during urination. ? Frequent urination.  Fever during labor, along with: ? Bad-smelling discharge. ? Uterine tenderness. ? Rapid heartbeat in the mother, baby, or both. Rare but serious symptoms of a possible GBS-related infection in women include:  Blood infection (septicemia). This may cause fever, chills, or confusion.  Lung infection (pneumonia). This may cause fever, chills, cough, rapid breathing, difficulty breathing, or chest pain.  Bone, joint, skin, or soft tissue infection. How is this diagnosed? You may be screened for GBS between week 35 and week 37 of your pregnancy. If  you have symptoms of preterm labor, you may be screened earlier. This condition is diagnosed based on lab test results from:  A swab of fluid from the vagina and rectum.  A urine sample. How is this treated? This condition is treated with antibiotic medicine. When you go into labor, or as soon as your water breaks (your membranes rupture), you will be given antibiotics through an IV tube. Antibiotics will continue until after you give birth. If you are having a cesarean delivery, you do not need antibiotics unless your membranes have already ruptured. Follow these instructions at home:  Take over-the-counter and prescription medicines only as told by your health care provider.  Take your antibiotic medicine as told by your health care provider. Do not stop taking the antibiotic even if you start to feel better.  Keep all pre-birth (prenatal) visits and follow-up visits as told by your health care provider. This is important. Contact a health care provider if:  You have pain or burning when you urinate.  You have to urinate frequently.  You have a fever or chills.  You develop a bad-smelling vaginal discharge. Get help right away if:  Your membranes rupture.  You go into labor.  You have severe pain in your abdomen.  You have difficulty breathing.  You have chest pain. This information is not intended to replace advice given to you by your health care provider. Make sure you discuss any questions you have with your health care provider. Document Released: 05/17/2007 Document Revised: 09/04/2015 Document Reviewed: 09/03/2015 Elsevier Interactive Patient Education  2019 [employer]  

## 2018-07-13 LAB — STREP GP B NAA: Strep Gp B NAA: NEGATIVE

## 2018-07-14 ENCOUNTER — Inpatient Hospital Stay: Payer: 59 | Admitting: Anesthesiology

## 2018-07-14 ENCOUNTER — Other Ambulatory Visit: Payer: Self-pay

## 2018-07-14 ENCOUNTER — Inpatient Hospital Stay
Admission: EM | Admit: 2018-07-14 | Discharge: 2018-07-16 | DRG: 806 | Disposition: A | Discharge status: Home / Self Care | Payer: 59 | Attending: Certified Nurse Midwife | Admitting: Certified Nurse Midwife

## 2018-07-14 DIAGNOSIS — O42913 Preterm premature rupture of membranes, unspecified as to length of time between rupture and onset of labor, third trimester: Principal | ICD-10-CM | POA: Diagnosis present

## 2018-07-14 DIAGNOSIS — A6000 Herpesviral infection of urogenital system, unspecified: Secondary | ICD-10-CM | POA: Diagnosis present

## 2018-07-14 DIAGNOSIS — Z8759 Personal history of other complications of pregnancy, childbirth and the puerperium: Secondary | ICD-10-CM | POA: Diagnosis not present

## 2018-07-14 DIAGNOSIS — O42013 Preterm premature rupture of membranes, onset of labor within 24 hours of rupture, third trimester: Secondary | ICD-10-CM | POA: Diagnosis not present

## 2018-07-14 DIAGNOSIS — O9832 Other infections with a predominantly sexual mode of transmission complicating childbirth: Secondary | ICD-10-CM | POA: Diagnosis present

## 2018-07-14 DIAGNOSIS — Z1159 Encounter for screening for other viral diseases: Secondary | ICD-10-CM | POA: Diagnosis not present

## 2018-07-14 DIAGNOSIS — Z3A36 36 weeks gestation of pregnancy: Secondary | ICD-10-CM

## 2018-07-14 LAB — COMPREHENSIVE METABOLIC PANEL
ALT: 12 U/L (ref 0–44)
AST: 19 U/L (ref 15–41)
Albumin: 3.2 g/dL — ABNORMAL LOW (ref 3.5–5.0)
Alkaline Phosphatase: 111 U/L (ref 38–126)
Anion gap: 12 (ref 5–15)
BUN: 11 mg/dL (ref 6–20)
CO2: 19 mmol/L — ABNORMAL LOW (ref 22–32)
Calcium: 8.8 mg/dL — ABNORMAL LOW (ref 8.9–10.3)
Chloride: 106 mmol/L (ref 98–111)
Creatinine, Ser: 0.55 mg/dL (ref 0.44–1.00)
GFR calc Af Amer: >60 mL/min (ref >60)
GFR calc non Af Amer: >60 mL/min (ref >60)
Glucose, Bld: 92 mg/dL (ref 70–99)
Potassium: 3.9 mmol/L (ref 3.5–5.1)
Sodium: 137 mmol/L (ref 135–145)
Total Bilirubin: 0.5 mg/dL (ref 0.3–1.2)
Total Protein: 6.4 g/dL — ABNORMAL LOW (ref 6.5–8.1)

## 2018-07-14 LAB — RUPTURE OF MEMBRANE (ROM)PLUS: Rom Plus: POSITIVE

## 2018-07-14 LAB — CBC
HCT: 35.7 % — ABNORMAL LOW (ref 36.0–46.0)
Hemoglobin: 12 g/dL (ref 12.0–15.0)
MCH: 30.8 pg (ref 26.0–34.0)
MCHC: 33.6 g/dL (ref 30.0–36.0)
MCV: 91.8 fL (ref 80.0–100.0)
Platelets: 178 10*3/uL (ref 150–400)
RBC: 3.89 MIL/uL (ref 3.87–5.11)
RDW: 13.8 % (ref 11.5–15.5)
WBC: 9.6 10*3/uL (ref 4.0–10.5)
nRBC: 0 % (ref 0.0–0.2)

## 2018-07-14 LAB — PROTEIN / CREATININE RATIO, URINE
Creatinine, Urine: 172 mg/dL
Protein Creatinine Ratio: 0.14 mg/mg{Cre} (ref 0.00–0.15)
Total Protein, Urine: 24 mg/dL

## 2018-07-14 LAB — SARS CORONAVIRUS 2 BY RT PCR (HOSPITAL ORDER, PERFORMED IN ~~LOC~~ HOSPITAL LAB): SARS Coronavirus 2: NEGATIVE

## 2018-07-14 LAB — ABO/RH: ABO/RH(D): A POS

## 2018-07-14 MED ORDER — NIFEDIPINE 10 MG PO CAPS: 20.0000 mg | ORAL_CAPSULE | ORAL | Status: DC | PRN

## 2018-07-14 MED ORDER — ONDANSETRON HCL 4 MG PO TABS: 4.0000 mg | ORAL_TABLET | ORAL | Status: DC | PRN

## 2018-07-14 MED ORDER — NIFEDIPINE 10 MG PO CAPS: 10.0000 mg | ORAL_CAPSULE | ORAL | Status: DC | PRN

## 2018-07-14 MED ORDER — OXYCODONE-ACETAMINOPHEN 5-325 MG PO TABS: 1.0000 | ORAL_TABLET | ORAL | Status: DC | PRN

## 2018-07-14 MED ORDER — LACTATED RINGERS IV SOLN: 500.0000 mL | INTRAVENOUS | Status: DC | PRN

## 2018-07-14 MED ORDER — PHENYLEPHRINE 40 MCG/ML (10ML) SYRINGE FOR IV PUSH (FOR BLOOD PRESSURE SUPPORT): 80.0000 ug | PREFILLED_SYRINGE | INTRAVENOUS | Status: DC | PRN

## 2018-07-14 MED ORDER — LACTATED RINGERS IV SOLN
INTRAVENOUS | Status: DC
  Administered 2018-07-14 (×2): via INTRAVENOUS

## 2018-07-14 MED ORDER — FENTANYL 2.5 MCG/ML W/ROPIVACAINE 0.15% IN NS 100 ML EPIDURAL (ARMC): 12.0000 mL/h | EPIDURAL | Status: DC

## 2018-07-14 MED ORDER — TERBUTALINE SULFATE 1 MG/ML IJ SOLN: 0.2500 mg | Freq: Once | INTRAMUSCULAR | Status: DC | PRN

## 2018-07-14 MED ORDER — CEFAZOLIN SODIUM-DEXTROSE 2-4 GM/100ML-% IV SOLN
2.0000 g | Freq: Once | INTRAVENOUS | Status: AC
  Administered 2018-07-15: 2 g via INTRAVENOUS
  Filled 2018-07-14: qty 100

## 2018-07-14 MED ORDER — AMMONIA AROMATIC IN INHA
RESPIRATORY_TRACT | Status: AC
  Filled 2018-07-14: qty 10

## 2018-07-14 MED ORDER — ZOLPIDEM TARTRATE 5 MG PO TABS: 5.0000 mg | ORAL_TABLET | Freq: Every evening | ORAL | Status: DC | PRN

## 2018-07-14 MED ORDER — MISOPROSTOL 50MCG HALF TABLET: 50.0000 ug | ORAL_TABLET | ORAL | Status: DC | PRN

## 2018-07-14 MED ORDER — DIPHENHYDRAMINE HCL 25 MG PO CAPS: 25.0000 mg | ORAL_CAPSULE | Freq: Four times a day (QID) | ORAL | Status: DC | PRN

## 2018-07-14 MED ORDER — SOD CITRATE-CITRIC ACID 500-334 MG/5ML PO SOLN: 30.0000 mL | ORAL | Status: DC | PRN

## 2018-07-14 MED ORDER — ONDANSETRON HCL 4 MG/2ML IJ SOLN: 4.0000 mg | Freq: Four times a day (QID) | INTRAMUSCULAR | Status: DC | PRN

## 2018-07-14 MED ORDER — EPHEDRINE 5 MG/ML INJ: 10.0000 mg | INTRAVENOUS | Status: DC | PRN

## 2018-07-14 MED ORDER — ACETAMINOPHEN 325 MG PO TABS: 650.0000 mg | ORAL_TABLET | ORAL | Status: DC | PRN

## 2018-07-14 MED ORDER — MISOPROSTOL 200 MCG PO TABS
ORAL_TABLET | ORAL | Status: AC
  Filled 2018-07-14: qty 4

## 2018-07-14 MED ORDER — DIPHENHYDRAMINE HCL 50 MG/ML IJ SOLN: 12.5000 mg | INTRAMUSCULAR | Status: DC | PRN

## 2018-07-14 MED ORDER — BUTORPHANOL TARTRATE 2 MG/ML IJ SOLN
1.0000 mg | INTRAMUSCULAR | Status: DC | PRN
  Administered 2018-07-14 (×2): 1 mg via INTRAVENOUS
  Filled 2018-07-14 (×2): qty 1

## 2018-07-14 MED ORDER — BUPIVACAINE HCL (PF) 0.25 % IJ SOLN
INTRAMUSCULAR | Status: DC | PRN
  Administered 2018-07-14: 4 mL via EPIDURAL
  Administered 2018-07-14: 3 mL via EPIDURAL

## 2018-07-14 MED ORDER — FENTANYL 2.5 MCG/ML W/ROPIVACAINE 0.15% IN NS 100 ML EPIDURAL (ARMC)
EPIDURAL | Status: DC | PRN
  Administered 2018-07-14: 12 mL/h via EPIDURAL

## 2018-07-14 MED ORDER — ONDANSETRON HCL 4 MG/2ML IJ SOLN: 4.0000 mg | INTRAMUSCULAR | Status: DC | PRN

## 2018-07-14 MED ORDER — IBUPROFEN 600 MG PO TABS
600.0000 mg | ORAL_TABLET | Freq: Four times a day (QID) | ORAL | Status: DC
  Administered 2018-07-15 – 2018-07-16 (×7): 600 mg via ORAL
  Filled 2018-07-14 (×6): qty 1

## 2018-07-14 MED ORDER — LABETALOL HCL 5 MG/ML IV SOLN
40.0000 mg | INTRAVENOUS | Status: DC | PRN
  Filled 2018-07-14: qty 8

## 2018-07-14 MED ORDER — LIDOCAINE HCL (PF) 1 % IJ SOLN
INTRAMUSCULAR | Status: AC
  Filled 2018-07-14: qty 30

## 2018-07-14 MED ORDER — LIDOCAINE HCL (PF) 1 % IJ SOLN: 30.0000 mL | INTRAMUSCULAR | Status: DC | PRN

## 2018-07-14 MED ORDER — OXYTOCIN BOLUS FROM INFUSION
500.0000 mL | Freq: Once | INTRAVENOUS | Status: AC
  Administered 2018-07-14: 500 mL via INTRAVENOUS

## 2018-07-14 MED ORDER — OXYTOCIN 10 UNIT/ML IJ SOLN
INTRAMUSCULAR | Status: AC
  Filled 2018-07-14: qty 2

## 2018-07-14 MED ORDER — LIDOCAINE-EPINEPHRINE (PF) 1.5 %-1:200000 IJ SOLN
INTRAMUSCULAR | Status: DC | PRN
  Administered 2018-07-14: 4 mL via PERINEURAL

## 2018-07-14 MED ORDER — OXYTOCIN 40 UNITS IN NORMAL SALINE INFUSION - SIMPLE MED
2.5000 [IU]/h | INTRAVENOUS | Status: DC
  Administered 2018-07-14: 2.5 [IU]/h via INTRAVENOUS
  Filled 2018-07-14: qty 1000

## 2018-07-14 MED ORDER — OXYCODONE-ACETAMINOPHEN 5-325 MG PO TABS: 2.0000 | ORAL_TABLET | ORAL | Status: DC | PRN

## 2018-07-14 MED ORDER — FENTANYL 2.5 MCG/ML W/ROPIVACAINE 0.15% IN NS 100 ML EPIDURAL (ARMC)
EPIDURAL | Status: AC
  Filled 2018-07-14: qty 100

## 2018-07-14 MED ORDER — OXYTOCIN 40 UNITS IN NORMAL SALINE INFUSION - SIMPLE MED
1.0000 m[IU]/min | INTRAVENOUS | Status: DC
  Administered 2018-07-14: 2 m[IU]/min via INTRAVENOUS

## 2018-07-14 MED ORDER — LIDOCAINE HCL (PF) 1 % IJ SOLN
INTRAMUSCULAR | Status: DC | PRN
  Administered 2018-07-14: 4 mL via INTRADERMAL

## 2018-07-14 MED ORDER — LACTATED RINGERS IV SOLN
500.0000 mL | Freq: Once | INTRAVENOUS | Status: DC
  Administered 2018-07-15: 125 mL via INTRAVENOUS

## 2018-07-14 NOTE — Anesthesia Procedure Notes (Signed)
Epidural Patient location during procedure: OB  Staffing Performed: anesthesiologist   Preanesthetic Checklist Completed: patient identified, site marked, surgical consent, pre-op evaluation, timeout performed, IV checked, risks and benefits discussed and monitors and equipment checked  Epidural Patient position: sitting Prep: Betadine Patient monitoring: heart rate, continuous pulse ox and blood pressure Approach: midline Location: L4-L5 Injection technique: LOR saline  Needle:  Needle type: Tuohy  Needle gauge: 17 G Needle length: 9 cm and 9 Needle insertion depth: 5 cm Catheter type: closed end flexible Catheter size: 19 Gauge Catheter at skin depth: 11 cm Test dose: negative and 1.5% lidocaine with Epi 1:200 K  Assessment Sensory level: T10 Events: blood not aspirated, injection not painful, no injection resistance, negative IV test and no paresthesia  Additional Notes   Patient tolerated the insertion well without complications.-SATD -IVTD. No paresthesia. Refer to [facility] nursing for VS and dosingReason for block:procedure for pain     

## 2018-07-14 NOTE — Progress Notes (Signed)
Patient ID: Taylor Kirk, female   DOB: October 30, 1985, 33 y.o.   MRN: [id number]  Jamal Haskin is a 33 y.o. G1P0000 at [redacted]w[redacted]d by ultrasound admitted for preterm premature rupture of membranes  Subjective:  Patient standing in room, reports back pain with contractions.   Denies difficulty breathing or respiratory distress, chest pain, abdominal pain, vaginal bleeding, dysuria, and leg pain or swelling.   FOB at bedside for continuous labor support.   Objective:  Temp:  [98 F (36.7 C)-98.4 F (36.9 C)] 98 F (36.7 C) (05/23 1704) Pulse Rate:  [81-95] 86 (05/23 1704) Resp:  [18] 18 (05/23 1256) BP: (139-155)/(85-103) 152/103 (05/23 1704) Weight:  [81.6 kg] 81.6 kg (05/23 1256)  Fetal Wellbeing:  Category I  UC:   irregular, every two (2) to three (3) minutes, soft resting tone; pitocin infusing at 4 mu/min  SVE:   Dilation: 2.5 Effacement (%): 60 Station: -2 Exam by:: Lunsford RN   No lesion present on speculum exam.  Labs:  Protein / creatinine ratio, urine     Status: None   Collection Time: 07/14/18 12:58 PM  Result Value Ref Range   Creatinine, Urine 172 mg/dL   Total Protein, Urine 24 mg/dL    Comment: NO NORMAL RANGE ESTABLISHED FOR THIS TEST   Protein Creatinine Ratio 0.14 0.00 - 0.15 mg/mg[Cre]    Comment: Performed at [facility], [address] [place], [place] [address]  ROM Plus [facility] only)     Status: None   Collection Time: 07/14/18  1:13 PM  Result Value Ref Range   Rom Plus POSITIVE     Comment: Performed at [facility], [address] [place], [place] [address]  CBC     Status: Abnormal   Collection Time: 07/14/18  1:36 PM  Result Value Ref Range   WBC 9.6 4.0 - 10.5 K/uL   RBC 3.89 3.87 - 5.11 MIL/uL   Hemoglobin 12.0 12.0 - 15.0 g/dL   HCT 86.5 (L) 78.4 - 69.6 %   MCV 91.8 80.0 - 100.0 fL   MCH 30.8 26.0 - 34.0 pg   MCHC 33.6 30.0 - 36.0 g/dL   RDW 29.5 28.4 - 13.2 %   Platelets 178 150 - 400 K/uL   nRBC  0.0 0.0 - 0.2 %    Comment: Performed at [facility], [address] [place], [place] [address]  Comprehensive metabolic panel     Status: Abnormal   Collection Time: 07/14/18  1:36 PM  Result Value Ref Range   Sodium 137 135 - 145 mmol/L   Potassium 3.9 3.5 - 5.1 mmol/L   Chloride 106 98 - 111 mmol/L   CO2 19 (L) 22 - 32 mmol/L   Glucose, Bld 92 70 - 99 mg/dL   BUN 11 6 - 20 mg/dL   Creatinine, Ser 2.72 0.44 - 1.00 mg/dL   Calcium 8.8 (L) 8.9 - 10.3 mg/dL   Total Protein 6.4 (L) 6.5 - 8.1 g/dL   Albumin 3.2 (L) 3.5 - 5.0 g/dL   AST 19 15 - 41 U/L   ALT 12 0 - 44 U/L   Alkaline Phosphatase 111 38 - 126 U/L   Total Bilirubin 0.5 0.3 - 1.2 mg/dL   GFR calc non Af Amer >60 >60 mL/min   GFR calc Af Amer >60 >60 mL/min   Anion gap 12 5 - 15    Comment: Performed at [facility], [address], [place], [place] [address]  Type and screen [facility]     Status: None (Preliminary result)   Collection Time: 07/14/18  2:44 PM  Result Value Ref Range   ABO/RH(D) PENDING    Antibody Screen PENDING    Sample Expiration      07/17/2018,2359 Performed at [facility], [address], [place], [place] [address]   SARS Coronavirus 2 (CEPHEID - Performed in [facility] hospital lab), Hosp Order     Status: None   Collection Time: 07/14/18  3:56 PM  Result Value Ref Range   SARS Coronavirus 2 NEGATIVE NEGATIVE    Comment: (NOTE) If result is NEGATIVE SARS-CoV-2 target nucleic acids are NOT DETECTED. The SARS-CoV-2 RNA is generally detectable in upper and lower  respiratory specimens during the acute phase of infection. The lowest  concentration of SARS-CoV-2 viral copies this assay can detect is 250  copies / mL. A negative result does not preclude SARS-CoV-2 infection  and should not be used as the sole basis for treatment or other  patient management decisions.  A negative result may occur with  improper specimen collection /  handling, submission of specimen other  than nasopharyngeal swab, presence of viral mutation(s) within the  areas targeted by this assay, and inadequate number of viral copies  (<250 copies / mL). A negative result must be combined with clinical  observations, patient history, and epidemiological information. If result is POSITIVE SARS-CoV-2 target nucleic acids are DETECTED. The SARS-CoV-2 RNA is generally detectable in upper and lower  respiratory specimens dur ing the acute phase of infection.  Positive  results are indicative of active infection with SARS-CoV-2.  Clinical  correlation with patient history and other diagnostic information is  necessary to determine patient infection status.  Positive results do  not rule out bacterial infection or co-infection with other viruses. If result is PRESUMPTIVE POSTIVE SARS-CoV-2 nucleic acids MAY BE PRESENT.   A presumptive positive result was obtained on the submitted specimen  and confirmed on repeat testing.  While 2019 novel coronavirus  (SARS-CoV-2) nucleic acids may be present in the submitted sample  additional confirmatory testing may be necessary for epidemiological  and / or clinical management purposes  to differentiate between  SARS-CoV-2 and other Sarbecovirus currently known to infect humans.  If clinically indicated additional testing with an alternate test  methodology [phone]) is advised. The SARS-CoV-2 RNA is generally  detectable in upper and lower respiratory sp ecimens during the acute  phase of infection. The expected result is Negative. Fact Sheet for Patients:  BoilerBrush.com.cy Fact Sheet for Healthcare Providers: https://pope.com/ This test is not yet approved or cleared by the [place] FDA and has been authorized for detection and/or diagnosis of SARS-CoV-2 by FDA under an Emergency Use Authorization (EUA).  This EUA will remain in effect (meaning this  test can be used) for the duration of the COVID-19 declaration under Section 564(b)(1) of the Act, 21 U.S.C. section 360bbb-3(b)(1), unless the authorization is terminated or revoked sooner. Performed at [facility], [address] [place], [place] [address]      Assessment:  Abisola Pfeffer is a 33 y.o. 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 Demonstrated hip squeezes and counter pressure massage techniques to spouse. Copy of [employer] given.   Will limited cervical exam  due to PPROM, see orders.   Reviewed red flag symptoms and when to call.   Continue orders as written. Reassess as needed.    Gunnar BullaJenkins Michelle Duha Abair, CNM [facility], [facility] 07/14/2018, 5:56 PM

## 2018-07-14 NOTE — Progress Notes (Signed)
Patient ID: Taylor Kirk, female   DOB: [birth year], 33 y.o.   MRN: [id number]  Taylor Kirk is a 33 y.o. G1P0000 at [redacted]w[redacted]d by ultrasound admitted for preterm premature rupture of membranes  Subjective:  Reports pain relief since epidural placement. Notes occasional vaginal pressure. Request Jello.   Denies difficulty breathing or respiratory distress, chest pain, abdominal pain, vaginal bleeding, dysuria, and leg pain or swelling.   Objective:  Temp:  [97.8 F (36.6 C)-98.4 F (36.9 C)] 97.8 F (36.6 C) (05/23 2024) Pulse Rate:  [81-111] 111 (05/23 2040) Resp:  [16-18] 16 (05/23 2024) BP: (104-155)/(49-103) 121/78 (05/23 2040) Weight:  [81.6 kg] 81.6 kg (05/23 1256)  Fetal Wellbeing:  Category I  UC:   regular, every two (2) to three (3) minutes, soft resting tone; pitocin 4 mu/min  SVE:   Dilation: 5 Effacement (%): 90 Station: -1 Exam by:: Willodean Rosenthal CNM  Labs: Lab Results  Component Value Date   WBC 9.6 07/14/2018   HGB 12.0 07/14/2018   HCT 35.7 (L) 07/14/2018   MCV 91.8 07/14/2018   PLT 178 07/14/2018    Assessment:  Taylor Winkler Bookeris a 33 y.o.G1P0000 at [redacted]w[redacted]d admitted for preterm premature rupture of membranes, Rh positive, 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   Reviewed red flag symptoms and when to call.   Continue orders as written. Reassess as needed.    Gunnar Bulla, CNM [facility], [facility] 07/14/2018, 8:52 PM

## 2018-07-14 NOTE — OB Triage Note (Signed)
Pt arrived from ED with c/o of LOF that started at 1100 that was clear. Denies CTX. Denies bleeding. +FM. CNM notified of BP.

## 2018-07-14 NOTE — Anesthesia Preprocedure Evaluation (Signed)
Anesthesia Evaluation  Patient identified by MRN, date of birth, ID band Patient awake    Reviewed: Allergy & Precautions, H&P , NPO status , Patient's Chart, lab work & pertinent test results, reviewed documented beta blocker date and time   Airway Mallampati: II  TM Distance: >3 FB Neck ROM: full    Dental no notable dental hx. (+) Teeth Intact   Pulmonary neg pulmonary ROS, Current Smoker,    Pulmonary exam normal breath sounds clear to auscultation       Cardiovascular Exercise Tolerance: Good hypertension,  Rhythm:regular Rate:Normal     Neuro/Psych negative neurological ROS  negative psych ROS   GI/Hepatic negative GI ROS, Neg liver ROS,   Endo/Other  negative endocrine ROSdiabetes  Renal/GU      Musculoskeletal   Abdominal   Peds  Hematology negative hematology ROS (+)   Anesthesia Other Findings   Reproductive/Obstetrics (+) Pregnancy                             Anesthesia Physical Anesthesia Plan  ASA: II  Anesthesia Plan: Epidural   Post-op Pain Management:    Induction:   PONV Risk Score and Plan:   Airway Management Planned:   Additional Equipment:   Intra-op Plan:   Post-operative Plan:   Informed Consent: I have reviewed the patients History and Physical, chart, labs and discussed the procedure including the risks, benefits and alternatives for the proposed anesthesia with the patient or authorized representative who has indicated his/her understanding and acceptance.       Plan Discussed with:   Anesthesia Plan Comments:         Anesthesia Quick Evaluation

## 2018-07-14 NOTE — H&P (Signed)
Obstetric History and Physical  Taylor Kirk is a 33 y.o. G1P0000 with IUP at [redacted]w[redacted]d presenting with leakage of clear fluid since 1100 .   Patient states she has been having  none contractions, none vaginal bleeding, clear fluid membranes, with active fetal movement.    Denies difficulty breathing or respiratory distress, headache, chest pain, abdominal or epigastric pain, vaginal bleeding, dysuria, and leg pain or swelling.   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 Genital herpes     History reviewed. No pertinent surgical history.  OB History  Gravida Para Term Preterm AB Living  1 0 0 0 0 0  SAB TAB Ectopic Multiple Live Births  0 0 0 0 0    # Outcome Date GA Lbr Len/2nd Weight Sex Delivery Anes PTL Lv  1 Current             Social History   Socioeconomic History  . Marital status: Married    Spouse name: Not on file  . Number of children: Not on file  . Years of education: Not on file  . Highest education level: Not on file  Occupational History  . Occupation: Physical Therapist  Social Needs  . Financial resource strain: Not hard at all  . Food insecurity:    Worry: Never true    Inability: Never true  . Transportation needs:    Medical: No    Non-medical: No  Tobacco Use  . Smoking status: Never Smoker  . Smokeless tobacco: Never Used  Substance and Sexual Activity  .  Alcohol use: Not Currently  . Drug use: Not Currently  . Sexual activity: Yes    Birth control/protection: None  Lifestyle  . Physical activity:    Days per week: Not on file    Minutes per session: Not on file  . Stress: Not on file  Relationships  . Social connections:    Talks on phone: Not on file    Gets together: Not on file    Attends religious service: Not on file    Active member of club or organization: Not on file    Attends meetings of clubs or organizations: Not on file    Relationship status: Married  Other Topics Concern  . Not on file  Social History Narrative  . Not on file    Family History  Problem Relation Age of Onset  . Pancreatic cancer Paternal Grandfather   . Breast cancer Maternal Aunt   . Healthy Mother   . Healthy Father     Medications Prior to Admission  Medication Sig Dispense Refill Last Dose  . Prenatal Vit-Fe Fumarate-FA (PRENATAL MULTIVITAMIN) TABS tablet Take 1 tablet by mouth daily at 12 noon.   07/14/2018 at Unknown time  . valACYclovir (VALTREX) 500 MG tablet Take 1 tablet (500 mg total) by mouth daily. Can increase to twice a day for 5 days in the  event of a recurrence 30 tablet 12 07/13/2018 at Unknown time  . vitamin C (ASCORBIC ACID) 500 MG tablet Take 500 mg by mouth daily.   07/13/2018 at Unknown time  . valACYclovir (VALTREX) 1000 MG tablet Take 1 tablet (1,000 mg total) by mouth daily. 30 tablet 1     Allergies  Allergen Reactions  . Sulfa Antibiotics Rash    Review of Systems: Negative except for what is mentioned in HPI.  Physical Exam:  Temp:  [98.4 F (36.9 C)] 98.4 F (36.9 C) (05/23 1256) Pulse Rate:  [85-95] 86 (05/23 1350) Resp:  [18] 18 (05/23 1256) BP: (139-155)/(85-103) 147/93 (05/23 1350) Weight:  [81.6 kg] 81.6 kg (05/23 1256)  GENERAL: Well-developed, well-nourished female in no acute distress.   LUNGS: Clear to auscultation bilaterally.   HEART: Regular rate and rhythm.  ABDOMEN: Soft, nontender,  nondistended, gravid.  EXTREMITIES: Nontender, no edema, 2+ distal pulses. Cervical Exam: Dilation: 2 Presentation: Vertex Exam by:: Jailene Cupit CNM  Fetal wellbeing: Category I  Contractions: every one (1) to five (5) minutes, soft resting tone   Pertinent Labs/Studies:   Results for orders placed or performed during the hospital encounter of 07/14/18 (from the past 24 hour(s))  ROM Plus ([facility] only)     Status: None   Collection Time: 07/14/18  1:13 PM  Result Value Ref Range   Rom Plus POSITIVE   CBC     Status: Abnormal   Collection Time: 07/14/18  1:36 PM  Result Value Ref Range   WBC 9.6 4.0 - 10.5 K/uL   RBC 3.89 3.87 - 5.11 MIL/uL   Hemoglobin 12.0 12.0 - 15.0 g/dL   HCT 16.1 (L) 09.6 - 04.5 %   MCV 91.8 80.0 - 100.0 fL   MCH 30.8 26.0 - 34.0 pg   MCHC 33.6 30.0 - 36.0 g/dL   RDW 40.9 81.1 - 91.4 %   Platelets 178 150 - 400 K/uL   nRBC 0.0 0.0 - 0.2 %  Comprehensive metabolic panel     Status: Abnormal   Collection Time: 07/14/18  1:36 PM  Result Value Ref Range   Sodium 137 135 - 145 mmol/L   Potassium 3.9 3.5 - 5.1 mmol/L   Chloride 106 98 - 111 mmol/L   CO2 19 (L) 22 - 32 mmol/L   Glucose, Bld 92 70 - 99 mg/dL   BUN 11 6 - 20 mg/dL   Creatinine, Ser 7.82 0.44 - 1.00 mg/dL   Calcium 8.8 (L) 8.9 - 10.3 mg/dL   Total Protein 6.4 (L) 6.5 - 8.1 g/dL   Albumin 3.2 (L) 3.5 - 5.0 g/dL   AST 19 15 - 41 U/L   ALT 12 0 - 44 U/L   Alkaline Phosphatase 111 38 - 126 U/L   Total Bilirubin 0.5 0.3 - 1.2 mg/dL   GFR calc non Af Amer >60 >60 mL/min   GFR calc Af Amer >60 >60 mL/min   Anion gap 12 5 - 15  Type and screen [facility]     Status: None (Preliminary result)   Collection Time: 07/14/18  2:44 PM  Result Value Ref Range   ABO/RH(D) PENDING    Antibody Screen PENDING    Sample Expiration      07/17/2018,2359 Performed at [facility], [address] [place], [place] [address]     Assessment : Taylor Kirk is a 33 y.o.  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   Labor: Expectant management.  Induction/Augmentation as needed, per protocol.  Will collect PIH labs due to elevated blood pressures on admission, see orders.   Reviewed red flag symptoms and when to call.   Delivery plan: Hopeful for vaginal delivery.  Dr. Logan BoresEvans notified of admission and plan of care.    Gunnar BullaJenkins Michelle Jerzy Roepke, CNM [facility], [facility] 07/14/18 3:13 PM

## 2018-07-15 LAB — TYPE AND SCREEN
ABO/RH(D): A POS
Antibody Screen: POSITIVE
Unit division: 0
Unit division: 0

## 2018-07-15 LAB — BPAM RBC
Blood Product Expiration Date: 202006082359
Blood Product Expiration Date: 202006082359
Unit Type and Rh: 6200
Unit Type and Rh: 6200

## 2018-07-15 LAB — CBC
HCT: 31.6 % — ABNORMAL LOW (ref 36.0–46.0)
Hemoglobin: 10.6 g/dL — ABNORMAL LOW (ref 12.0–15.0)
MCH: 30.7 pg (ref 26.0–34.0)
MCHC: 33.5 g/dL (ref 30.0–36.0)
MCV: 91.6 fL (ref 80.0–100.0)
Platelets: 155 10*3/uL (ref 150–400)
RBC: 3.45 MIL/uL — ABNORMAL LOW (ref 3.87–5.11)
RDW: 13.8 % (ref 11.5–15.5)
WBC: 14.5 10*3/uL — ABNORMAL HIGH (ref 4.0–10.5)
nRBC: 0 % (ref 0.0–0.2)

## 2018-07-15 LAB — COMPREHENSIVE METABOLIC PANEL
ALT: 12 U/L (ref 0–44)
AST: 24 U/L (ref 15–41)
Albumin: 2.5 g/dL — ABNORMAL LOW (ref 3.5–5.0)
Alkaline Phosphatase: 82 U/L (ref 38–126)
Anion gap: 6 (ref 5–15)
BUN: 11 mg/dL (ref 6–20)
CO2: 19 mmol/L — ABNORMAL LOW (ref 22–32)
Calcium: 8.2 mg/dL — ABNORMAL LOW (ref 8.9–10.3)
Chloride: 112 mmol/L — ABNORMAL HIGH (ref 98–111)
Creatinine, Ser: 0.53 mg/dL (ref 0.44–1.00)
GFR calc Af Amer: >60 mL/min (ref >60)
GFR calc non Af Amer: >60 mL/min (ref >60)
Glucose, Bld: 91 mg/dL (ref 70–99)
Potassium: 3.9 mmol/L (ref 3.5–5.1)
Sodium: 137 mmol/L (ref 135–145)
Total Bilirubin: 0.5 mg/dL (ref 0.3–1.2)
Total Protein: 5.4 g/dL — ABNORMAL LOW (ref 6.5–8.1)

## 2018-07-15 MED ORDER — MAGNESIUM OXIDE 400 (241.3 MG) MG PO TABS
400.0000 mg | ORAL_TABLET | Freq: Every day | ORAL | Status: DC
  Administered 2018-07-15 – 2018-07-16 (×2): 400 mg via ORAL
  Filled 2018-07-15 (×2): qty 1

## 2018-07-15 MED ORDER — DIBUCAINE (PERIANAL) 1 % EX OINT: 1.0000 "application " | TOPICAL_OINTMENT | CUTANEOUS | Status: DC | PRN

## 2018-07-15 MED ORDER — WITCH HAZEL-GLYCERIN EX PADS: 1.0000 "application " | MEDICATED_PAD | CUTANEOUS | Status: DC | PRN

## 2018-07-15 MED ORDER — BENZOCAINE-MENTHOL 20-0.5 % EX AERO: 1.0000 "application " | INHALATION_SPRAY | CUTANEOUS | Status: DC | PRN

## 2018-07-15 MED ORDER — COCONUT OIL OIL
1.0000 "application " | TOPICAL_OIL | Status: DC | PRN
  Administered 2018-07-15: 1 via TOPICAL
  Filled 2018-07-15: qty 120

## 2018-07-15 MED ORDER — MAGNESIUM OXIDE 400 (241.3 MG) MG PO TABS: 400.0000 mg | ORAL_TABLET | Freq: Every day | ORAL | 3 refills | Status: DC

## 2018-07-15 MED ORDER — PRENATAL MULTIVITAMIN CH
1.0000 | ORAL_TABLET | Freq: Every day | ORAL | Status: DC
  Administered 2018-07-15 – 2018-07-16 (×2): 1 via ORAL
  Filled 2018-07-15 (×2): qty 1

## 2018-07-15 MED ORDER — FERROUS SULFATE 325 (65 FE) MG PO TABS: 325.0000 mg | ORAL_TABLET | Freq: Two times a day (BID) | ORAL | 3 refills | Status: DC

## 2018-07-15 MED ORDER — IBUPROFEN 600 MG PO TABS: 600.0000 mg | ORAL_TABLET | Freq: Four times a day (QID) | ORAL | 0 refills | Status: DC

## 2018-07-15 MED ORDER — SIMETHICONE 80 MG PO CHEW: 80.0000 mg | CHEWABLE_TABLET | ORAL | Status: DC | PRN

## 2018-07-15 MED ORDER — FERROUS SULFATE 325 (65 FE) MG PO TABS
325.0000 mg | ORAL_TABLET | Freq: Two times a day (BID) | ORAL | Status: DC
  Administered 2018-07-15 – 2018-07-16 (×2): 325 mg via ORAL
  Filled 2018-07-15 (×2): qty 1

## 2018-07-15 MED ORDER — SODIUM CHLORIDE 0.9 % IV SOLN: 250.0000 mL | INTRAVENOUS | Status: DC | PRN

## 2018-07-15 MED ORDER — SODIUM CHLORIDE 0.9% FLUSH: 3.0000 mL | Freq: Two times a day (BID) | INTRAVENOUS | Status: DC

## 2018-07-15 MED ORDER — SENNOSIDES-DOCUSATE SODIUM 8.6-50 MG PO TABS
2.0000 | ORAL_TABLET | ORAL | Status: DC
  Administered 2018-07-15 – 2018-07-16 (×2): 2 via ORAL
  Filled 2018-07-15 (×2): qty 2

## 2018-07-15 MED ORDER — SODIUM CHLORIDE 0.9% FLUSH: 3.0000 mL | INTRAVENOUS | Status: DC | PRN

## 2018-07-15 NOTE — Anesthesia Postprocedure Evaluation (Signed)
Anesthesia Post Note  Patient: Taylor Kirk  Procedure(s) Performed: AN AD HOC LABOR EPIDURAL  Patient location during evaluation: Mother Baby Anesthesia Type: Epidural Level of consciousness: awake and alert Pain management: pain level controlled Vital Signs Assessment: post-procedure vital signs reviewed and stable Respiratory status: spontaneous breathing, nonlabored ventilation and respiratory function stable Cardiovascular status: stable Postop Assessment: no headache, no backache and epidural receding Anesthetic complications: no     Last Vitals:  Vitals:   07/15/18 0350 07/15/18 0753  BP: 118/67 124/79  Pulse: 90 80  Resp: 18 20  Temp: 37.2 C 36.8 C  SpO2: 97% 98%    Last Pain:  Vitals:   07/15/18 0753  TempSrc: Oral  PainSc:                  [name]

## 2018-07-15 NOTE — Progress Notes (Signed)
Patient ID: Taylor Kirk, female   DOB: [birth year], 33 y.o.   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   Denies difficulty breathing or respiratory distress, chest pain, abdominal pain, excessive vaginal bleeding, dysuria, and leg pain or swelling.   Objective:  Temp:  [97.8 F (36.6 C)-98.9 F (37.2 C)] 98 F (36.7 C) (05/24 1133) Pulse Rate:  [80-111] 83 (05/24 1133) Resp:  [16-20] 18 (05/24 1133) BP: (104-155)/(49-103) 124/72 (05/24 1133) SpO2:  [97 %-100 %] 98 % (05/24 0753) Weight:  [81.6 kg] 81.6 kg (05/23 1256)  Physical Exam:   General: alert and cooperative   Lungs: clear to auscultation bilaterally  Breasts: normal appearance, no masses or tenderness  Heart: normal apical impulse  Abdomen: soft, non-tender; bowel sounds normal; no masses,  no organomegaly  Pelvis: Lochia: appropriate, Uterine Fundus: firm  Extremities: DVT Evaluation: Negative Homan's sign.No significant calf/ankle edema.  CBC Latest Ref Rng & Units 07/15/2018 07/14/2018 05/09/2018  WBC 4.0 - 10.5 K/uL 14.5(H) 9.6 9.6  Hemoglobin 12.0 - 15.0 g/dL 10.6(L) 12.0 12.1  Hematocrit 36.0 - 46.0 % 31.6(L) 35.7(L) 35.3  Platelets 150 - 400 K/uL 155 178 205   CMP Latest Ref Rng & Units 07/15/2018 07/14/2018 10/16/2017  Glucose 70 - 99 mg/dL 91 92 87  BUN 6 - 20 mg/dL 11 11 11   Creatinine 0.44 - 1.00 mg/dL 1.60 7.37 1.06  Sodium 135 - 145 mmol/L 137 137 139  Potassium 3.5 - 5.1 mmol/L 3.9 3.9 3.5  Chloride 98 - 111 mmol/L 112(H) 106 101  CO2 22 - 32 mmol/L 19(L) 19(L) 21  Calcium 8.9 - 10.3 mg/dL 8.2(L) 8.8(L) 9.0  Total Protein 6.5 - 8.1 g/dL 2.6(R) 6.4(L) 6.9  Total Bilirubin 0.3 - 1.2 mg/dL 0.5 0.5 0.5  Alkaline Phos 38 - 126 U/L 82 111 32(L)  AST 15 - 41 U/L 24 19 17   ALT 0 - 44 U/L 12 12 10     Assessment:  33 year old G1P1 Post Partum Day #  1, s/p spontaneous vaginal birth, Rh positive, blood loss anemia   Breastfeeding  Plan:  Routine postpartum care and education.   Rx Iron and magnesium, see orders.   Reviewed red flag symptoms and when to call.   Continue orders as written. Reassess as needed.     LOS: 1 day   Gunnar Bulla, CNM [facility], [facility] 07/15/2018 11:49 AM

## 2018-07-15 NOTE — Lactation Note (Signed)
This note was copied from a baby's chart. Lactation Consultation Note  Patient Name: Taylor Kirk Date: 07/15/2018 Reason for consult: Follow-up assessment;Mother's request;Difficult latch;Primapara;Late-preterm 34-36.6wks  Assisted mom with almost every breast feed today.  Taylor Kirk was born at 36.[redacted] weeks gestation and initially had low blood sugars.  Taylor Kirk had difficulty latching and sustaining latch with first feeds today sucking in his lower lip which caused problems with getting good deep latch.  He has been spitting with every feeding.  Dr. Noralyn Pick wanted Taylor Kirk to be supplemented with 10 to 15 ml of Enfamil 22 calorie formula.  Tried feeding formula with SNS, spoonfeeding and finger feeding with curved tip syringe.  Whenever formula was introduced, he would spit it all back up shortly after feeding.  Taylor Kirk prefers right breast.  Encouraged mom to hand express and/or pump left breast or both breasts if poor or no breast feed.  Plan is to check blood sugars before next 2 feedings allowing mom to just breast feed if WNL, but to supplement with 22 cal formula if blood sugar drops again.  Reviewed supply and demand, normal course of lactation, feeding cues and routine newborn feeding patterns.   Maternal Data Formula Feeding for Exclusion: No Has patient been taught Hand Expression?: Yes Does the patient have breastfeeding experience prior to this delivery?: No(Gr1)  Feeding Feeding Type: Breast Fed  LATCH Score Latch: Repeated attempts needed to sustain latch, nipple held in mouth throughout feeding, stimulation needed to elicit sucking reflex.  Audible Swallowing: A few with stimulation  Type of Nipple: Everted at rest and after stimulation  Comfort (Breast/Nipple): Soft / non-tender  Hold (Positioning): Assistance needed to correctly position infant at breast and maintain latch.  LATCH Score: 7  Interventions Interventions: Assisted with latch;Skin to skin;Breast massage;Hand  express;Breast compression;Adjust position;Support pillows;Position options;Coconut oil  Lactation Tools Discussed/Used Tools: Pump;Coconut oil Breast pump type: Double-Electric Breast Pump WIC Program: No(UHC Insurance) Pump Review: Setup, frequency, and cleaning;Milk Storage;Other (comment) Initiated by:: S.Charlotte Fidalgo,RN,BSN,IBCLC Date initiated:: 07/15/18   Consult Status Consult Status: Follow-up Follow-up type: Call as needed    Taylor Kirk 07/15/2018, 10:19 PM

## 2018-07-16 NOTE — Discharge Summary (Signed)
Obstetric Discharge Summary  Patient ID: Taylor Kirk MRN: [id number] DOB/AGE: [birth year] 35 y.o.   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  Patient had an uncomplicated postpartum course.  By time of discharge on PPD#2, her pain was controlled on oral pain medications; she had appropriate lochia and was ambulating, voiding without difficulty and tolerating regular diet.  She was deemed stable for discharge to home.    Labs: CBC Latest Ref Rng & Units 07/15/2018 07/14/2018 05/09/2018  WBC 4.0 - 10.5 K/uL 14.5(H) 9.6 9.6  Hemoglobin 12.0 - 15.0 g/dL 10.6(L) 12.0 12.1  Hematocrit 36.0 - 46.0 % 31.6(L) 35.7(L) 35.3  Platelets 150 - 400 K/uL 155 178 205   A POS  Physical exam:   Temp:  [98 F (36.7 C)-98.7 F (37.1 C)] 98.7 F (37.1 C) (05/24 2344) Pulse Rate:  [78-83] 78 (05/24 2344) Resp:  [18-20] 20 (05/24 2344) BP: (124-136)/(72-93) 136/93 (05/24 2344) SpO2:  [98 %-99 %] 99 % (05/24 2344)  General: alert and no distress  Lochia: appropriate  Abdomen: soft, NT  Uterine Fundus: firm  Perineum: healing well, no significant drainage, no dehiscence, no significant erythema  Extremities: No evidence of DVT seen on physical exam. No lower extremity edema  Discharge Instructions: Per After Visit Summary.  Activity: Advance as tolerated. Pelvic rest for 6 weeks.  Also refer to After Visit   Summary  Diet: Regular  Medications: Allergies as of 07/16/2018      Reactions   Sulfa Antibiotics Rash      Medication List    STOP taking these medications   valACYclovir 1000 MG tablet Commonly known as:  VALTREX   valACYclovir 500 MG tablet Commonly known as:  VALTREX     TAKE these medications   ferrous sulfate 325 (65 FE) MG tablet Take 1 tablet (325 mg total) by mouth 2 (two) times daily with a meal.   ibuprofen 600 MG tablet Commonly known as:  ADVIL Take 1 tablet (600 mg total) by mouth every 6 (six) hours.   magnesium oxide 400 (241.3 Mg) MG tablet Commonly known as:  MAG-OX Take 1 tablet (400 mg total) by mouth daily.   prenatal multivitamin Tabs tablet Take 1 tablet by mouth daily at 12 noon.   vitamin C 500 MG tablet Commonly known as:  ASCORBIC ACID Take 500 mg by mouth daily.      Outpatient follow up:  Follow-up Information    Gunnar BullaLawhorn, Moyinoluwa Dawe Michelle, CNM. Call in 6 week(s).   Specialties:  Certified Nurse Midwife, Obstetrics and Gynecology, Radiology Why:  Please call to schedule six (6) week postpartum visit [facility] Contact information: [address] [place] [id number] [phone]        [facility]. Schedule an appointment as soon as possible for a visit in 1 week(s).   Why:  Please make an appointment for blood pressure check x 1-2 weeks Contact information: [address]  [address] [place] [address] [phone]         Postpartum contraception: Will discuss further at six (6) week postpartum visit  Discharged Condition: stable  Discharged to: home   Newborn Data:  Disposition:home with mother  Apgars: APGAR (1 MIN): 8   APGAR (5 MINS): 9     Baby Feeding: Breast   Gunnar Bulla, CNM [facility], [facility] 07/16/18 4:03 AM

## 2018-07-16 NOTE — Discharge Instructions (Signed)
Please call your doctor or return to the ER if you experience any chest pains, shortness of breath, dizziness, visual changes, fever greater than 101, any heavy bleeding (saturating more than 1 pad per hour), large clots, or foul smelling discharge, any worsening abdominal pain and cramping that is not controlled by pain medication, any breast concerns (redness/pain), or any signs of postpartum depression. No tampons, enemas, douches, or sexual intercourse for 6 weeks. Also avoid tub baths, hot tubs, or swimming for 6 weeks.       Care of a Perineal Tear A perineal tear is a cut or tear (laceration) in the tissue between the opening of the vagina and the anus (perineum). Some women develop a perineal tear during a vaginal birth. This can happen as the baby emerges from the birth canal and the perineum is stretched. There are four degrees of perineal tears based on how deep and long the laceration is:  First degree. This involves a shallow tear at the edge of the vaginal opening that extends slightly into the perineal skin.  Second degree. This involves tearing described in first degree perineal tear, and an additional deeper tear of the vaginal opening and perineal tissues. It may also include tearing of a muscle just under the perineal skin.  Third degree. This involves tearing described in first and second degree perineal tears, with the addition that tearing in the third degree extends into the muscle of the anus (anal sphincter).  Fourth degree. This involves all levels of tears described in first, second, and third degree perineal tears, with the tear in the fourth degree extending into the rectum. First and second degree perineal tears may or may not be stitched closed, depending on their location and appearance. Third and fourth degree perineal tears are stitched closed immediately after the babys birth. What are the risks? Depending on the type of perineal tear you have, you may be at risk  for:  Bleeding.  Developing a collection of blood in the perineal tear area (hematoma).  Pain. This may include pain when you urinate, or pain when you have a bowel movement.  Infection at the site of the tear.  Fever.  Trouble controlling your urination or bowels (incontinence).  Painful sex. How to care for a perineal tear Wound care  Take a sitz bath as told by your health care provider. A sitz bath is a warm water bath that is taken while you are sitting down. The water should only come up to your hips and should cover your buttocks. This can speed up healing. ? Partially fill a bathtub with warm water. You will only need the water to be deep enough to cover your hips and buttocks when you are sitting in it. ? If your health care provider told you to put medicine in the water, follow the directions exactly as told. ? Sit in the water and open the tub drain a little. ? Turn on the warm water again to keep the tub at the correct level. Keep the water running constantly. ? Soak in the water for 15-20 minutes or as told by your health care provider. ? After the sitz bath, pat the affected area dry first. Do not rub it. ? Be careful when you stand up after the sitz bath because you may feel dizzy.  Wash your hands before and after applying medicine to the area.  Wear a sanitary pad as told by your health care provider. Change the pad as often as  told by your health care provider.  Leave stitches (sutures), skin glue, or adhesive strips in place. These skin closures may need to stay in place for 2 weeks or longer. If adhesive strip edges start to loosen and curl up, you may trim the loose edges. Do not remove adhesive strips completely unless your health care provider tells you to do that.  Check your wound every day for signs of infection. Check for: ? Redness, swelling, or pain. ? Fluid or blood. ? Warmth. ? Pus or a bad smell. Managing pain  If directed, put ice on the  painful area: ? Put ice in a plastic bag. ? Place a towel between your skin and the bag. ? Leave the ice on for 20 minutes, 2-3 times a day.  Apply a numbing spray to the perineal tear site as told by your health care provider. This may help with discomfort.  Take and apply over-the-counter and prescription medicines only as told by your health care provider.  If told, put about 3 witch hazel-containing hemorrhoid treatment pads on top of your sanitary pad. The witch hazel in the hemorrhoid pads helps with swelling and discomfort.  Sit on an inflatable ring or pillow. This may provide comfort. General instructions  Squeeze warm water on your perineum after urinating. This should be done from front to back with a squeeze bottle. Pat the area to dry it.  Do not have sex, use tampons, or place anything in your vagina for at least 6 weeks or as told by your health care provider.  Keep all follow-up visits as told by your health care provider. These include any postpartum visits. This is important. Contact a health care provider if:  Your pain is not relieved with medicines.  You have painful urination.  You have redness, swelling, or pain around your tear.  You have fluid or blood coming from your tear.  Your tear feels warm to the touch.  You have pus or a bad smell coming from your tear.  You have a fever. Get help right away if:  Your tear opens.  You cannot urinate.  You have an increase in bleeding.  You have severe pain. Summary  A perineal tear is a cut or tear (laceration) in the tissue between the opening of the vagina and the anus (perineum).  There are four degrees of perineal tears based on how deep and long the laceration is.  First and second-degree perineal tears may or may not be stitched closed, depending on their location and appearance. Third and fourth- degree perineal tears are stitched closed immediately after the babys birth.  Follow your health  care provider's instructions for caring for your perineal tear. Know how to manage pain and how to care for your wound. Know when to call your health care provider and when to seek immediate emergency care. This information is not intended to replace advice given to you by your health care provider. Make sure you discuss any questions you have with your health care provider. Document Released: 06/24/2013 Document Revised: 03/14/2016 Document Reviewed: 03/14/2016 Elsevier Interactive Patient Education  2019 [employer]. Breast Pumping Tips There may be times when you cannot feed your baby from your breast, such as when you are at work or on a trip. Breast pumping allows you to remove milk from your breast in order to store for later use. There are three ways to pump. You can use:  Your hand to massage and squeeze your breast (hand  expression).  A handheld manual pump.  An electric pump. When you first start to pump, you may not get much milk, but after a few days your breasts should start to make more. Pumping can help stimulate your milk supply after your baby is born. It can also help maintain your milk supply when you are away from your baby. When should I pump? You can start pumping soon after your baby is born. Here are some tips on when to pump:  When with your baby: ? Pump after breastfeeding. ? Pump from the free breast while you breastfeed.  When away from your baby: ? Pump every 2-3 hours for about 15 minutes. ? Pump both breasts at the same time if you can.  If your baby gets formula feeding, pump around the time your baby gets that feeding.  If you drank alcohol, wait 2 hours before pumping.  If you are having a procedure with anesthesia, talk to your health care provider about when you should pump before and after. How do I prepare to pump? Take steps to relax. This makes it easier to stimulate your let-down reflex, which is what makes breast milk flow. To help:  Smell  one of your infant's blankets or an item of clothing.  Look at a picture or video of your infant.  Sit in a quiet, private space.  Massage your breast and nipple.  Place a warm cloth on your breast. The cloth should be a little wet.  Play relaxing music.  Picture your milk flowing. What are some tips? General tips for pumping breast milk  Always wash your hands before pumping.  If you are not getting very much milk or pumping is uncomfortable, make adjustments to your pump or try using different type of pumps.  Drink enough fluid to keep your urine clear or pale yellow.  Wear clothing that opens in the front or allows easy access to your breasts.  Pump breast milk directly into clean bottles or other storage containers.  Do not use any products that contain nicotine or tobacco, such as cigarettes and e-cigarettes. These can lower your milk supply and harm your infant. If you need help quitting, ask your health care provider. Tips for storing breast milk  Store breast milk in a clean, BPA-free container, such as glass or plastic bottles or milk storage bags.  Store breast milk in 2-4 ounce batches to reduce waste.  Swirl the breast milk in the container to mix any cream that floats to the top. Do not shake it.  Label all stored milk with the date you pumped it.  The amount of time you can keep breast milk depends on where it is stored: ? Room temperature: 6-8 hours, if the milk is clean. It is best if used within 4 hours. ? Cooler with ice packs: 24 hours. ? Refrigerator: 5-8 days, if the milk is clean. It is best if used within 3 days. ? Freezer: 9-12 months, if the milk is clean and stored away from the freezer door. It is best if used within 6 months.  When using a refrigerator or freezer, put the milk in the back to keep it as cold as possible.  Thaw frozen milk using warm water. Do not use the microwave. Tips for choosing a breast pump The right pump for you will  depend on your comfort and how often you will be away from your baby. When choosing a pump, consider the following:  Manual breast pumps  do not need electricity to work. They are usually cheaper than electric pumps, but they can be harder to use. They may be a good choice if you are occasionally away from your baby.  Electric breast pumps are usually more expensive than manual pumps, but they can be easier for some women to use. They can also collect more milk than manual pumps. This makes them a good choice for women who work in an office or need to be away from their baby for longer periods of time.  The suction cup (flange) should be the right size. If it is the wrong size, it may cause pain and nipple damage.  Before buying a pump, find out whether your insurance covers the cost of a breast pump. Tips for maintaining a breast pump  Check your pump's manual for cleaning tips.  Clean the pump after each use. To do this: ? Wipe down the electrical unit. Use a dry, soft cloth or clean paper towel. Do not put the electrical unit in water or cleaning products. ? Wash the plastic pump parts with soap and warm water or in the dishwasher, if the parts are dishwasher safe. You do not need to clean the tubing unless it comes in contact with breast milk. Let the parts air dry. Avoid drying them with a cloth or towel. ? When the pump parts are clean and dry, put the pump back together. Then store the pump.  If there is water in the tubing when it comes time to pump, attach the tubing to the pump and turn on the pump. Run the pump until the tube is dry.  Avoid touching the inside of pump parts that come in contact with breast milk. Summary  Pumping can help stimulate your milk supply after your baby is born. It can also help maintain your milk supply when you are away from your baby.  When you are away from your infant for several hours, pump for about 15 minutes every 2-3 hours. Pump both breasts at  the same time, if you can.  Your health care provider or lactation consultant can help you decide which breast pump is right for you. The right pump for you depends on your comfort, work schedule, and how often you may be away from your baby. This information is not intended to replace advice given to you by your health care provider. Make sure you discuss any questions you have with your health care provider. Document Released: 07/28/2009 Document Revised: 10/27/2017 Document Reviewed: 03/14/2016 Elsevier Interactive Patient Education  2019 [employer]. Breastfeeding  Choosing to breastfeed is one of the best decisions you can make for yourself and your baby. A change in hormones during pregnancy causes your breasts to make breast milk in your milk-producing glands. Hormones prevent breast milk from being released before your baby is born. They also prompt milk flow after birth. Once breastfeeding has begun, thoughts of your baby, as well as his or her sucking or crying, can stimulate the release of milk from your milk-producing glands. Benefits of breastfeeding Research shows that breastfeeding offers many health benefits for infants and mothers. It also offers a cost-free and convenient way to feed your baby. For your baby  Your first milk (colostrum) helps your baby's digestive system to function better.  Special cells in your milk (antibodies) help your baby to fight off infections.  Breastfed babies are less likely to develop asthma, allergies, obesity, or type 2 diabetes. They are also at lower  risk for sudden infant death syndrome (SIDS).  Nutrients in breast milk are better able to meet your babys needs compared to infant formula.  Breast milk improves your baby's brain development. For you  Breastfeeding helps to create a very special bond between you and your baby.  Breastfeeding is convenient. Breast milk costs nothing and is always available at the correct  temperature.  Breastfeeding helps to burn calories. It helps you to lose the weight that you gained during pregnancy.  Breastfeeding makes your uterus return faster to its size before pregnancy. It also slows bleeding (lochia) after you give birth.  Breastfeeding helps to lower your risk of developing type 2 diabetes, osteoporosis, rheumatoid arthritis, cardiovascular disease, and breast, ovarian, uterine, and endometrial cancer later in life. Breastfeeding basics Starting breastfeeding  Find a comfortable place to sit or lie down, with your neck and back well-supported.  Place a pillow or a rolled-up blanket under your baby to bring him or her to the level of your breast (if you are seated). Nursing pillows are specially designed to help support your arms and your baby while you breastfeed.  Make sure that your baby's tummy (abdomen) is facing your abdomen.  Gently massage your breast. With your fingertips, massage from the outer edges of your breast inward toward the nipple. This encourages milk flow. If your milk flows slowly, you may need to continue this action during the feeding.  Support your breast with 4 fingers underneath and your thumb above your nipple (make the letter "C" with your hand). Make sure your fingers are well away from your nipple and your babys mouth.  Stroke your baby's lips gently with your finger or nipple.  When your baby's mouth is open wide enough, quickly bring your baby to your breast, placing your entire nipple and as much of the areola as possible into your baby's mouth. The areola is the colored area around your nipple. ? More areola should be visible above your baby's upper lip than below the lower lip. ? Your baby's lips should be opened and extended outward (flanged) to ensure an adequate, comfortable latch. ? Your baby's tongue should be between his or her lower gum and your breast.  Make sure that your baby's mouth is correctly positioned around  your nipple (latched). Your baby's lips should create a seal on your breast and be turned out (everted).  It is common for your baby to suck about 2-3 minutes in order to start the flow of breast milk. Latching Teaching your baby how to latch onto your breast properly is very important. An improper latch can cause nipple pain, decreased milk supply, and poor weight gain in your baby. Also, if your baby is not latched onto your nipple properly, he or she may swallow some air during feeding. This can make your baby fussy. Burping your baby when you switch breasts during the feeding can help to get rid of the air. However, teaching your baby to latch on properly is still the best way to prevent fussiness from swallowing air while breastfeeding. Signs that your baby has successfully latched onto your nipple  Silent tugging or silent sucking, without causing you pain. Infant's lips should be extended outward (flanged).  Swallowing heard between every 3-4 sucks once your milk has started to flow (after your let-down milk reflex occurs).  Muscle movement above and in front of his or her ears while sucking. Signs that your baby has not successfully latched onto your nipple  Sucking  sounds or smacking sounds from your baby while breastfeeding.  Nipple pain. If you think your baby has not latched on correctly, slip your finger into the corner of your babys mouth to break the suction and place it between your baby's gums. Attempt to start breastfeeding again. Signs of successful breastfeeding Signs from your baby  Your baby will gradually decrease the number of sucks or will completely stop sucking.  Your baby will fall asleep.  Your baby's body will relax.  Your baby will retain a small amount of milk in his or her mouth.  Your baby will let go of your breast by himself or herself. Signs from you  Breasts that have increased in firmness, weight, and size 1-3 hours after feeding.  Breasts  that are softer immediately after breastfeeding.  Increased milk volume, as well as a change in milk consistency and color by the fifth day of breastfeeding.  Nipples that are not sore, cracked, or bleeding. Signs that your baby is getting enough milk  Wetting at least 1-2 diapers during the first 24 hours after birth.  Wetting at least 5-6 diapers every 24 hours for the first week after birth. The urine should be clear or pale yellow by the age of 5 days.  Wetting 6-8 diapers every 24 hours as your baby continues to grow and develop.  At least 3 stools in a 24-hour period by the age of 5 days. The stool should be soft and yellow.  At least 3 stools in a 24-hour period by the age of 7 days. The stool should be seedy and yellow.  No loss of weight greater than 10% of birth weight during the first 3 days of life.  Average weight gain of 4-7 oz (113-198 g) per week after the age of 4 days.  Consistent daily weight gain by the age of 5 days, without weight loss after the age of 2 weeks. After a feeding, your baby may spit up a small amount of milk. This is normal. Breastfeeding frequency and duration Frequent feeding will help you make more milk and can prevent sore nipples and extremely full breasts (breast engorgement). Breastfeed when you feel the need to reduce the fullness of your breasts or when your baby shows signs of hunger. This is called "breastfeeding on demand." Signs that your baby is hungry include:  Increased alertness, activity, or restlessness.  Movement of the head from side to side.  Opening of the mouth when the corner of the mouth or cheek is stroked (rooting).  Increased sucking sounds, smacking lips, cooing, sighing, or squeaking.  Hand-to-mouth movements and sucking on fingers or hands.  Fussing or crying. Avoid introducing a pacifier to your baby in the first 4-6 weeks after your baby is born. After this time, you may choose to use a pacifier. Research has  shown that pacifier use during the first year of a baby's life decreases the risk of sudden infant death syndrome (SIDS). Allow your baby to feed on each breast as long as he or she wants. When your baby unlatches or falls asleep while feeding from the first breast, offer the second breast. Because newborns are often sleepy in the first few weeks of life, you may need to awaken your baby to get him or her to feed. Breastfeeding times will vary from baby to baby. However, the following rules can serve as a guide to help you make sure that your baby is properly fed:  Newborns (babies 4 weeks of  age or younger) may breastfeed every 1-3 hours.  Newborns should not go without breastfeeding for longer than 3 hours during the day or 5 hours during the night.  You should breastfeed your baby a minimum of 8 times in a 24-hour period. Breast milk pumping     Pumping and storing breast milk allows you to make sure that your baby is exclusively fed your breast milk, even at times when you are unable to breastfeed. This is especially important if you go back to work while you are still breastfeeding, or if you are not able to be present during feedings. Your lactation consultant can help you find a method of pumping that works best for you and give you guidelines about how long it is safe to store breast milk. Caring for your breasts while you breastfeed Nipples can become dry, cracked, and sore while breastfeeding. The following recommendations can help keep your breasts moisturized and healthy:  Avoid using soap on your nipples.  Wear a supportive bra designed especially for nursing. Avoid wearing underwire-style bras or extremely tight bras (sports bras).  Air-dry your nipples for 3-4 minutes after each feeding.  Use only cotton bra pads to absorb leaked breast milk. Leaking of breast milk between feedings is normal.  Use lanolin on your nipples after breastfeeding. Lanolin helps to maintain your  skin's normal moisture barrier. Pure lanolin is not harmful (not toxic) to your baby. You may also hand express a few drops of breast milk and gently massage that milk into your nipples and allow the milk to air-dry. In the first few weeks after giving birth, some women experience breast engorgement. Engorgement can make your breasts feel heavy, warm, and tender to the touch. Engorgement peaks within 3-5 days after you give birth. The following recommendations can help to ease engorgement:  Completely empty your breasts while breastfeeding or pumping. You may want to start by applying warm, moist heat (in the shower or with warm, water-soaked hand towels) just before feeding or pumping. This increases circulation and helps the milk flow. If your baby does not completely empty your breasts while breastfeeding, pump any extra milk after he or she is finished.  Apply ice packs to your breasts immediately after breastfeeding or pumping, unless this is too uncomfortable for you. To do this: ? Put ice in a plastic bag. ? Place a towel between your skin and the bag. ? Leave the ice on for 20 minutes, 2-3 times a day.  Make sure that your baby is latched on and positioned properly while breastfeeding. If engorgement persists after 48 hours of following these recommendations, contact your health care provider or a [employer]. Overall health care recommendations while breastfeeding  Eat 3 healthy meals and 3 snacks every day. Well-nourished mothers who are breastfeeding need an additional 450-500 calories a day. You can meet this requirement by increasing the amount of a balanced diet that you eat.  Drink enough water to keep your urine pale yellow or clear.  Rest often, relax, and continue to take your prenatal vitamins to prevent fatigue, stress, and low vitamin and mineral levels in your body (nutrient deficiencies).  Do not use any products that contain nicotine or tobacco, such as cigarettes  and e-cigarettes. Your baby may be harmed by chemicals from cigarettes that pass into breast milk and exposure to secondhand smoke. If you need help quitting, ask your health care provider.  Avoid alcohol.  Do not use illegal drugs or marijuana.  Talk  with your health care provider before taking any medicines. These include over-the-counter and prescription medicines as well as vitamins and herbal supplements. Some medicines that may be harmful to your baby can pass through breast milk.  It is possible to become pregnant while breastfeeding. If birth control is desired, ask your health care provider about options that will be safe while breastfeeding your baby. Where to find more information: [employer]: www.llli.org Contact a health care provider if:  You feel like you want to stop breastfeeding or have become frustrated with breastfeeding.  Your nipples are cracked or bleeding.  Your breasts are red, tender, or warm.  You have: ? Painful breasts or nipples. ? A swollen area on either breast. ? A fever or chills. ? Nausea or vomiting. ? Drainage other than breast milk from your nipples.  Your breasts do not become full before feedings by the fifth day after you give birth.  You feel sad and depressed.  Your baby is: ? Too sleepy to eat well. ? Having trouble sleeping. ? More than 30 week old and wetting fewer than 6 diapers in a 24-hour period. ? Not gaining weight by 37 days of age.  Your baby has fewer than 3 stools in a 24-hour period.  Your baby's skin or the white parts of his or her eyes become yellow. Get help right away if:  Your baby is overly tired (lethargic) and does not want to wake up and feed.  Your baby develops an unexplained fever. Summary  Breastfeeding offers many health benefits for infant and mothers.  Try to breastfeed your infant when he or she shows early signs of hunger.  Gently tickle or stroke your baby's lips with your  finger or nipple to allow the baby to open his or her mouth. Bring the baby to your breast. Make sure that much of the areola is in your baby's mouth. Offer one side and burp the baby before you offer the other side.  Talk with your health care provider or lactation consultant if you have questions or you face problems as you breastfeed. This information is not intended to replace advice given to you by your health care provider. Make sure you discuss any questions you have with your health care provider. Document Released: 02/07/2005 Document Revised: 03/11/2016 Document Reviewed: 03/11/2016 Elsevier Interactive Patient Education  2019 [employer]. Home Care Instructions for Mom Activity  Gradually return to your regular activities.  Let yourself rest. Nap while your baby sleeps.  Avoid lifting anything that is heavier than 10 lb (4.5 kg) until your health care provider says it is okay.  Avoid activities that take a lot of effort and energy (are strenuous) until approved by your health care provider. Walking at a slow-to-moderate pace is usually safe.  If you had a cesarean delivery: ? Do not vacuum, climb stairs, or drive a car for 4-6 weeks. ? Have someone help you at home until you feel like you can do your usual activities yourself. ? Do exercises as told by your health care provider, if this applies. Vaginal bleeding You may continue to bleed for 4-6 weeks after delivery. Over time, the amount of blood usually decreases and the color of the blood usually gets lighter. However, the flow of bright red blood may increase if you have been too active. If you need to use more than one pad in an hour because your pad gets soaked, or if you pass a large clot:  Lie down.  Raise your feet.  Place a cold compress on your lower abdomen.  Rest.  Call your health care provider. If you are breastfeeding, your period should return anytime between 8 weeks after delivery and the time that you  stop breastfeeding. If you are not breastfeeding, your period should return 6-8 weeks after delivery. Perineal care The perineal area, or perineum, is the part of your body between your thighs. After delivery, this area needs special care. Follow these instructions as told by your health care provider.  Take warm tub baths for 15-20 minutes.  Use medicated pads and pain-relieving sprays and creams as told.  Do not use tampons or douches until vaginal bleeding has stopped.  Each time you go to the bathroom: ? Use a peri bottle. ? Change your pad. ? Use towelettes in place of toilet paper until your stitches have healed.  Do Kegel exercises every day. Kegel exercises help to maintain the muscles that support the vagina, bladder, and bowels. You can do these exercises while you are standing, sitting, or lying down. To do Kegel exercises: ? Tighten the muscles of your abdomen and the muscles that surround your birth canal. ? Hold for a few seconds. ? Relax. ? Repeat until you have done this 5 times in a row.  To prevent hemorrhoids from developing or getting worse: ? Drink enough fluid to keep your urine clear or pale yellow. ? Avoid straining when having a bowel movement. ? Take over-the-counter medicines and stool softeners as told by your health care provider. Breast care  Wear a tight-fitting bra.  Avoid taking over-the-counter pain medicine for breast discomfort.  Apply ice to the breasts to help with discomfort as needed: ? Put ice in a plastic bag. ? Place a towel between your skin and the bag. ? Leave the ice on for 20 minutes or as told by your health care provider. Nutrition  Eat a well-balanced diet.  Do not try to lose weight quickly by cutting back on calories.  Take your prenatal vitamins until your postpartum checkup or until your health care provider tells you to stop. Postpartum depression You may find yourself crying for no apparent reason and unable to cope  with all of the changes that come with having a newborn. This mood is called postpartum depression. Postpartum depression happens because your hormone levels change after delivery. If you have postpartum depression, get support from your partner, friends, and family. If the depression does not go away on its own after several weeks, contact your health care provider. Breast self-exam  Do a breast self-exam each month, at the same time of the month. If you are breastfeeding, check your breasts just after a feeding, when your breasts are less full. If you are breastfeeding and your period has started, check your breasts on day 5, 6, or 7 of your period. Report any lumps, bumps, or discharge to your health care provider. Know that breasts are normally lumpy if you are breastfeeding. This is temporary, and it is not a health risk. Intimacy and sexuality Avoid sexual activity for at least 3-4 weeks after delivery or until the brownish-red vaginal flow is completely gone. If you want to avoid pregnancy, use some form of birth control. You can get pregnant after delivery, even if you have not had your period. Contact a health care provider if:  You feel unable to cope with the changes that a child brings to your life, and these feelings do not  go away after several weeks.  You notice a lump, a bump, or discharge on your breast. Get help right away if:  Blood soaks your pad in 1 hour or less.  You have: ? Severe pain or cramping in your lower abdomen. ? A bad-smelling vaginal discharge. ? A fever that is not controlled by medicine. ? A fever, and an area of your breast is red and sore. ? Pain or redness in your calf. ? Sudden, severe chest pain. ? Shortness of breath. ? Painful or bloody urination. ? Problems with your vision. ? You vomit for 12 hours or longer. ? You develop a severe headache. ? You have serious thoughts about hurting yourself, your child, or anyone else. This information is  not intended to replace advice given to you by your health care provider. Make sure you discuss any questions you have with your health care provider. Document Released: 02/05/2000 Document Revised: 04/05/2017 Document Reviewed: 08/11/2014 Elsevier Interactive Patient Education  2019 [employer] Postpartum Care After Vaginal Delivery This sheet gives you information about how to care for yourself from the time you deliver your baby to up to 6-12 weeks after delivery (postpartum period). Your health care provider may also give you more specific instructions. If you have problems or questions, contact your health care provider. Follow these instructions at home: Vaginal bleeding  It is normal to have vaginal bleeding (lochia) after delivery. Wear a sanitary pad for vaginal bleeding and discharge. ? During the first week after delivery, the amount and appearance of lochia is often similar to a menstrual period. ? Over the next few weeks, it will gradually decrease to a dry, yellow-brown discharge. ? For most women, lochia stops completely by 4-6 weeks after delivery. Vaginal bleeding can vary from woman to woman.  Change your sanitary pads frequently. Watch for any changes in your flow, such as: ? A sudden increase in volume. ? A change in color. ? Large blood clots.  If you pass a blood clot from your vagina, save it and call your health care provider to discuss. Do not flush blood clots down the toilet before talking with your health care provider.  Do not use tampons or douches until your health care provider says this is safe.  If you are not breastfeeding, your period should return 6-8 weeks after delivery. If you are feeding your child breast milk only (exclusive breastfeeding), your period may not return until you stop breastfeeding. Perineal care  Keep the area between the vagina and the anus (perineum) clean and dry as told by your health care provider. Use medicated pads and  pain-relieving sprays and creams as directed.  If you had a cut in the perineum (episiotomy) or a tear in the vagina, check the area for signs of infection until you are healed. Check for: ? More redness, swelling, or pain. ? Fluid or blood coming from the cut or tear. ? Warmth. ? Pus or a bad smell.  You may be given a squirt bottle to use instead of wiping to clean the perineum area after you go to the bathroom. As you start healing, you may use the squirt bottle before wiping yourself. Make sure to wipe gently.  To relieve pain caused by an episiotomy, a tear in the vagina, or swollen veins in the anus (hemorrhoids), try taking a warm sitz bath 2-3 times a day. A sitz bath is a warm water bath that is taken while you are sitting down. The water should  only come up to your hips and should cover your buttocks. Breast care  Within the first few days after delivery, your breasts may feel heavy, full, and uncomfortable (breast engorgement). Milk may also leak from your breasts. Your health care provider can suggest ways to help relieve the discomfort. Breast engorgement should go away within a few days.  If you are breastfeeding: ? Wear a bra that supports your breasts and fits you well. ? Keep your nipples clean and dry. Apply creams and ointments as told by your health care provider. ? You may need to use breast pads to absorb milk that leaks from your breasts. ? You may have uterine contractions every time you breastfeed for up to several weeks after delivery. Uterine contractions help your uterus return to its normal size. ? If you have any problems with breastfeeding, work with your health care provider or Advertising copywriter.  If you are not breastfeeding: ? Avoid touching your breasts a lot. Doing this can make your breasts produce more milk. ? Wear a good-fitting bra and use cold packs to help with swelling. ? Do not squeeze out (express) milk. This causes you to make more  milk. Intimacy and sexuality  Ask your health care provider when you can engage in sexual activity. This may depend on: ? Your risk of infection. ? How fast you are healing. ? Your comfort and desire to engage in sexual activity.  You are able to get pregnant after delivery, even if you have not had your period. If desired, talk with your health care provider about methods of birth control (contraception). Medicines  Take over-the-counter and prescription medicines only as told by your health care provider.  If you were prescribed an antibiotic medicine, take it as told by your health care provider. Do not stop taking the antibiotic even if you start to feel better. Activity  Gradually return to your normal activities as told by your health care provider. Ask your health care provider what activities are safe for you.  Rest as much as possible. Try to rest or take a nap while your baby is sleeping. Eating and drinking   Drink enough fluid to keep your urine pale yellow.  Eat high-fiber foods every day. These may help prevent or relieve constipation. High-fiber foods include: ? Whole grain cereals and breads. ? Brown rice. ? Beans. ? Fresh fruits and vegetables.  Do not try to lose weight quickly by cutting back on calories.  Take your prenatal vitamins until your postpartum checkup or until your health care provider tells you it is okay to stop. Lifestyle  Do not use any products that contain nicotine or tobacco, such as cigarettes and e-cigarettes. If you need help quitting, ask your health care provider.  Do not drink alcohol, especially if you are breastfeeding. General instructions  Keep all follow-up visits for you and your baby as told by your health care provider. Most women visit their health care provider for a postpartum checkup within the first 3-6 weeks after delivery. Contact a health care provider if:  You feel unable to cope with the changes that your child  brings to your life, and these feelings do not go away.  You feel unusually sad or worried.  Your breasts become red, painful, or hard.  You have a fever.  You have trouble holding urine or keeping urine from leaking.  You have little or no interest in activities you used to enjoy.  You have not breastfed at  all and you have not had a menstrual period for 12 weeks after delivery.  You have stopped breastfeeding and you have not had a menstrual period for 12 weeks after you stopped breastfeeding.  You have questions about caring for yourself or your baby.  You pass a blood clot from your vagina. Get help right away if:  You have chest pain.  You have difficulty breathing.  You have sudden, severe leg pain.  You have severe pain or cramping in your lower abdomen.  You bleed from your vagina so much that you fill more than one sanitary pad in one hour. Bleeding should not be heavier than your heaviest period.  You develop a severe headache.  You faint.  You have blurred vision or spots in your vision.  You have bad-smelling vaginal discharge.  You have thoughts about hurting yourself or your baby. If you ever feel like you may hurt yourself or others, or have thoughts about taking your own life, get help right away. You can go to the nearest emergency department or call:  Your local emergency services (911 in the [place]  A suicide crisis helpline, such as the National Suicide Prevention Lifeline at 570-284-7454. This is open 24 hours a day. Summary  The period of time right after you deliver your newborn up to 6-12 weeks after delivery is called the postpartum period.  Gradually return to your normal activities as told by your health care provider.  Keep all follow-up visits for you and your baby as told by your health care provider. This information is not intended to replace advice given to you by your health care provider. Make sure you discuss any questions you  have with your health care provider. Document Released: 12/05/2006 Document Revised: 11/21/2016 Document Reviewed: 11/21/2016 Elsevier Interactive Patient Education  2019 [employer]. Postpartum Baby Blues The postpartum period begins right after the birth of a baby. During this time, there is often a lot of joy and excitement. It is also a time of many changes in the life of the parents. No matter how many times a mother gives birth, each child brings new challenges to the family, including different ways of relating to one another. It is common to have feelings of excitement along with confusing changes in moods, emotions, and thoughts. You may feel happy one minute and sad or stressed the next. These feelings of sadness usually happen in the period right after you have your baby, and they go away within a week or two. This is called the "baby blues." What are the causes? There is no known cause of baby blues. It is likely caused by a combination of factors. However, changes in hormone levels after childbirth are believed to trigger some of the symptoms. Other factors that can play a role in these mood changes include:  Lack of sleep.  Stressful life events, such as poverty, caring for a loved one, or death of a loved one.  Genetics. What are the signs or symptoms? Symptoms of this condition include:  Brief changes in mood, such as going from extreme happiness to sadness.  Decreased concentration.  Difficulty sleeping.  Crying spells and tearfulness.  Loss of appetite.  Irritability.  Anxiety. If the symptoms of baby blues last for more than 2 weeks or become more severe, you may have postpartum depression. How is this diagnosed? This condition is diagnosed based on an evaluation of your symptoms. There are no medical or lab tests that lead to a  diagnosis, but there are various questionnaires that a health care provider may use to identify women with the baby blues or postpartum  depression. How is this treated? Treatment is not needed for this condition. The baby blues usually go away on their own in 1-2 weeks. Social support is often all that is needed. You will be encouraged to get adequate sleep and rest. Follow these instructions at home: Lifestyle      Get as much rest as you can. Take a nap when the baby sleeps.  Exercise regularly as told by your health care provider. Some women find yoga and walking to be helpful.  Eat a balanced and nourishing diet. This includes plenty of fruits and vegetables, whole grains, and lean proteins.  Do little things that you enjoy. Have a cup of tea, take a bubble bath, read your favorite magazine, or listen to your favorite music.  Avoid alcohol.  Ask for help with household chores, cooking, grocery shopping, or running errands. Do not try to do everything yourself. Consider hiring a postpartum doula to help. This is a professional who specializes in providing support to new mothers.  Try not to make any major life changes during pregnancy or right after giving birth. This can add stress. General instructions  Talk to people close to you about how you are feeling. Get support from your partner, family members, friends, or other new moms. You may want to join a support group.  Find ways to cope with stress. This may include: ? Writing your thoughts and feelings in a journal. ? Spending time outside. ? Spending time with people who make you laugh.  Try to stay positive in how you think. Think about the things you are grateful for.  Take over-the-counter and prescription medicines only as told by your health care provider.  Let your health care provider know if you have any concerns.  Keep all postpartum visits as told by your health care provider. This is important. Contact a health care provider if:  Your baby blues do not go away after 2 weeks. Get help right away if:  You have thoughts of taking your own  life (suicidal thoughts).  You think you may harm the baby or other people.  You see or hear things that are not there (hallucinations). Summary  After giving birth, you may feel happy one minute and sad or stressed the next. Feelings of sadness that happen right after the baby is born and go away after a week or two are called the "baby blues."  You can manage the baby blues by getting enough rest, eating a healthy diet, exercising, spending time with supportive people, and finding ways to cope with stress.  If feelings of sadness and stress last longer than 2 weeks or get in the way of caring for your baby, talk to your health care provider. This may mean you have postpartum depression. This information is not intended to replace advice given to you by your health care provider. Make sure you discuss any questions you have with your health care provider. Document Released: 11/12/2003 Document Revised: 04/05/2016 Document Reviewed: 04/05/2016 Elsevier Interactive Patient Education  2019 [employer]

## 2018-07-16 NOTE — Progress Notes (Signed)
Discharge order received from doctor. Reviewed discharge instructions and prescriptions with patient and answered all questions. Follow up appointment instructions given. Patient verbalized understanding. ID bands checked. Patient discharged home with infant via wheelchair by nursing/auxillary.    Jovee Dettinger Garner, RN  

## 2018-07-17 LAB — GC/CHLAMYDIA PROBE AMP
Chlamydia trachomatis, NAA: NEGATIVE
Neisseria Gonorrhoeae by PCR: NEGATIVE

## 2018-07-18 ENCOUNTER — Encounter: Payer: 59 | Admitting: Obstetrics and Gynecology

## 2018-07-18 LAB — SURGICAL PATHOLOGY

## 2018-07-18 LAB — RPR, QUANT+TP ABS (REFLEX)
Rapid Plasma Reagin, Quant: 1:8 {titer} — ABNORMAL HIGH
T Pallidum Abs: NONREACTIVE

## 2018-07-18 LAB — RPR: RPR Ser Ql: REACTIVE — AB

## 2018-07-19 ENCOUNTER — Telehealth: Payer: Self-pay

## 2018-07-19 NOTE — Telephone Encounter (Signed)
Coronavirus (COVID-19) Are you at risk?  Are you at risk for the Coronavirus (COVID-19)?  To be considered HIGH RISK for Coronavirus (COVID-19), you have to meet the following criteria:  . Traveled to [place], [place], [place], [place] or [place]; or in the [place] to [place], [place], [place], or [place]; and have fever, cough, and shortness of breath within the last 2 weeks of travel OR . Been in close contact with a person diagnosed with COVID-19 within the last 2 weeks and have fever, cough, and shortness of breath . IF YOU DO NOT MEET THESE CRITERIA, YOU ARE CONSIDERED LOW RISK FOR COVID-19.  What to do if you are HIGH RISK for COVID-19?  . If you are having a medical emergency, call 911. . Seek medical care right away. Before you go to a doctor's office, urgent care or emergency department, call ahead and tell them about your recent travel, contact with someone diagnosed with COVID-19, and your symptoms. You should receive instructions from your physician's office regarding next steps of care.  . When you arrive at healthcare provider, tell the healthcare staff immediately you have returned from visiting [place], [place], [place], [place] or [place]; or traveled in the [place] to [place], [place], [place], or [place]; in the last two weeks or you have been in close contact with a person diagnosed with COVID-19 in the last 2 weeks.   . Tell the health care staff about your symptoms: fever, cough and shortness of breath. . After you have been seen by a medical provider, you will be either: o Tested for (COVID-19) and discharged home on quarantine except to seek medical care if symptoms worsen, and asked to  - Stay home and avoid contact with others until you get your results (4-5 days)  - Avoid travel on public transportation if possible (such as bus, train, or airplane) or o Sent to the Emergency Department by EMS for evaluation, COVID-19 testing, and possible  admission depending on your condition and test results.  What to do if you are LOW RISK for COVID-19?  Reduce your risk of any infection by using the same precautions used for avoiding the common cold or flu:  . Wash your hands often with soap and warm water for at least 20 seconds.  If soap and water are not readily available, use an alcohol-based hand sanitizer with at least 60% alcohol.  . If coughing or sneezing, cover your mouth and nose by coughing or sneezing into the elbow areas of your shirt or coat, into a tissue or into your sleeve (not your hands). . Avoid shaking hands with others and consider head nods or verbal greetings only. . Avoid touching your eyes, nose, or mouth with unwashed hands.  . Avoid close contact with people who are [place]. . Avoid places or events with large numbers of people in one location, like concerts or sporting events. . Carefully consider travel plans you have or are making. . If you are planning any travel outside or inside the [place], visit the CDC's Travelers' Health webpage for the latest health notices. . If you have some symptoms but not all symptoms, continue to monitor at home and seek medical attention if your symptoms worsen. . If you are having a medical emergency, call 911.  07/19/18 SCREENING NEG [facility] ADDITIONAL HEALTHCARE OPTIONS FOR PATIENTS  [facility] / e-Visit: [email or web address]         [facility]: [phone]    [facility]: [phone]                   [facility]: [phone]  

## 2018-07-20 ENCOUNTER — Ambulatory Visit (INDEPENDENT_AMBULATORY_CARE_PROVIDER_SITE_OTHER): Payer: 59 | Admitting: Certified Nurse Midwife

## 2018-07-20 ENCOUNTER — Other Ambulatory Visit: Payer: Self-pay

## 2018-07-20 ENCOUNTER — Encounter: Payer: Self-pay | Admitting: Certified Nurse Midwife

## 2018-07-20 ENCOUNTER — Telehealth: Payer: Self-pay

## 2018-07-20 VITALS — BP 140/98 | HR 98 | Ht 64.0 in | Wt 167.7 lb

## 2018-07-20 DIAGNOSIS — R030 Elevated blood-pressure reading, without diagnosis of hypertension: Secondary | ICD-10-CM

## 2018-07-20 DIAGNOSIS — O139 Gestational [pregnancy-induced] hypertension without significant proteinuria, unspecified trimester: Secondary | ICD-10-CM

## 2018-07-20 MED ORDER — LABETALOL HCL 100 MG PO TABS: 100.0000 mg | ORAL_TABLET | Freq: Two times a day (BID) | ORAL | 1 refills | Status: DC

## 2018-07-20 NOTE — Patient Instructions (Signed)
.  Preeclampsia and Eclampsia    Preeclampsia is a serious condition that may develop during pregnancy. It is also called toxemia of pregnancy. This condition causes high blood pressure along with other symptoms, such as swelling and headaches. These symptoms may develop as the condition gets worse. Preeclampsia may occur at 20 weeks of pregnancy or later.  Diagnosing and treating preeclampsia early is very important. If not treated early, it can cause serious problems for you and your baby. One problem it can lead to is eclampsia. Eclampsia is a condition that causes muscle jerking or shaking (convulsions or seizures) and other serious problems for the mother. During pregnancy, delivering your baby may be the best treatment for preeclampsia or eclampsia. For most women, preeclampsia and eclampsia symptoms go away after giving birth.  In rare cases, a woman may develop preeclampsia after giving birth (postpartum preeclampsia). This usually occurs within 48 hours after childbirth but may occur up to 6 weeks after giving birth.  What are the causes?  The cause of preeclampsia is not known.  What increases the risk?  The following risk factors make you more likely to develop preeclampsia:   Being pregnant for the first time.   Having had preeclampsia during a past pregnancy.   Having a family history of preeclampsia.   Having high blood pressure.   Being pregnant with more than one baby.   Being 35 or older.   Being African-American.   Having kidney disease or diabetes.   Having medical conditions such as lupus or blood diseases.   Being very overweight (obese).  What are the signs or symptoms?  The earliest signs of preeclampsia are:   High blood pressure.   Increased protein in your urine. Your health care provider will check for this at every visit before you give birth (prenatal visit).  Other symptoms that may develop as the condition gets worse include:   Severe headaches.   Sudden weight  gain.   Swelling of the hands, face, legs, and feet.   Nausea and vomiting.   Vision problems, such as blurred or double vision.   Numbness in the face, arms, legs, and feet.   Urinating less than usual.   Dizziness.   Slurred speech.   Abdominal pain, especially upper abdominal pain.   Convulsions or seizures.  How is this diagnosed?  There are no screening tests for preeclampsia. Your health care provider will ask you about symptoms and check for signs of preeclampsia during your prenatal visits. You may also have tests that include:   Urine tests.   Blood tests.   Checking your blood pressure.   Monitoring your baby's heart rate.   Ultrasound.  How is this treated?  You and your health care provider will determine the treatment approach that is best for you. Treatment may include:   Having more frequent prenatal exams to check for signs of preeclampsia, if you have an increased risk for preeclampsia.   Medicine to lower your blood pressure.   Staying in the hospital, if your condition is severe. There, treatment will focus on controlling your blood pressure and the amount of fluids in your body (fluid retention).   Taking medicine (magnesium sulfate) to prevent seizures. This may be given as an injection or through an IV.   Taking a low-dose aspirin during your pregnancy.   Delivering your baby early, if your condition gets worse. You may have your labor started with medicine (induced), or you may have a cesarean   delivery.  Follow these instructions at home:  Eating and drinking     Drink enough fluid to keep your urine pale yellow.   Avoid caffeine.  Lifestyle   Do not use any products that contain nicotine or tobacco, such as cigarettes and e-cigarettes. If you need help quitting, ask your health care provider.   Do not use alcohol or drugs.   Avoid stress as much as possible. Rest and get plenty of sleep.  General instructions   Take over-the-counter and prescription medicines only as  told by your health care provider.   When lying down, lie on your left side. This keeps pressure off your major blood vessels.   When sitting or lying down, raise (elevate) your feet. Try putting some pillows underneath your lower legs.   Exercise regularly. Ask your health care provider what kinds of exercise are best for you.   Keep all follow-up and prenatal visits as told by your health care provider. This is important.  How is this prevented?  There is no known way of preventing preeclampsia or eclampsia from developing. However, to lower your risk of complications and detect problems early:   Get regular prenatal care. Your health care provider may be able to diagnose and treat the condition early.   Maintain a healthy weight. Ask your health care provider for help managing weight gain during pregnancy.   Work with your health care provider to manage any long-term (chronic) health conditions you have, such as diabetes or kidney problems.   You may have tests of your blood pressure and kidney function after giving birth.   Your health care provider may have you take low-dose aspirin during your next pregnancy.  Contact a health care provider if:   You have symptoms that your health care provider told you may require more treatment or monitoring, such as:  ? Headaches.  ? Nausea or vomiting.  ? Abdominal pain.  ? Dizziness.  ? Light-headedness.  Get help right away if:   You have severe:  ? Abdominal pain.  ? Headaches that do not get better.  ? Dizziness.  ? Vision problems.  ? Confusion.  ? Nausea or vomiting.   You have any of the following:  ? A seizure.  ? Sudden, rapid weight gain.  ? Sudden swelling in your hands, ankles, or face.  ? Trouble moving any part of your body.  ? Numbness in any part of your body.  ? Trouble speaking.  ? Abnormal bleeding.   You faint.  Summary   Preeclampsia is a serious condition that may develop during pregnancy. It is also called toxemia of pregnancy.   This  condition causes high blood pressure along with other symptoms, such as swelling and headaches.   Diagnosing and treating preeclampsia early is very important. If not treated early, it can cause serious problems for you and your baby.   Get help right away if you have symptoms that your health care provider told you to watch for.  This information is not intended to replace advice given to you by your health care provider. Make sure you discuss any questions you have with your health care provider.  Document Released: 02/05/2000 Document Revised: 01/24/2017 Document Reviewed: 09/14/2015  Elsevier Interactive Patient Education  2019 [employer]

## 2018-07-20 NOTE — Progress Notes (Signed)
Pt presents today for follow up BP check 1 wk PP. SVD PPROM 07/14/2018. BP today 160/125, repeat after resting 140/98. PT states she is feeling well. Denies headache, visual changes , epigastric pain.   Objective: Swelling 1 + bilaterally, negative clonus, 2 + reflexes bilaterally. Weight: 167.7  Assessment Gestational Hypertension   Plan: Dr. Valentino Saxon consulted. Start on Laetolol 100 mg BID, Repeat pre e labs today. Pt has BP cuff at home. She is to monitor BP at home. She will follow up on Monday if Diastolic above 90. Red flag symptoms reviewed .   Doreene Burke, CNM

## 2018-07-20 NOTE — Telephone Encounter (Signed)
Coronavirus (COVID-19) Are you at risk?  Are you at risk for the Coronavirus (COVID-19)?  To be considered HIGH RISK for Coronavirus (COVID-19), you have to meet the following criteria:  . Traveled to [place], [place], [place], [place] or [place]; or in the [place] to [place], [place], [place], or [place]; and have fever, cough, and shortness of breath within the last 2 weeks of travel OR . Been in close contact with a person diagnosed with COVID-19 within the last 2 weeks and have fever, cough, and shortness of breath . IF YOU DO NOT MEET THESE CRITERIA, YOU ARE CONSIDERED LOW RISK FOR COVID-19.  What to do if you are HIGH RISK for COVID-19?  . If you are having a medical emergency, call 911. . Seek medical care right away. Before you go to a doctor's office, urgent care or emergency department, call ahead and tell them about your recent travel, contact with someone diagnosed with COVID-19, and your symptoms. You should receive instructions from your physician's office regarding next steps of care.  . When you arrive at healthcare provider, tell the healthcare staff immediately you have returned from visiting [place], [place], [place], [place] or [place]; or traveled in the [place] to [place], [place], [place], or [place]; in the last two weeks or you have been in close contact with a person diagnosed with COVID-19 in the last 2 weeks.   . Tell the health care staff about your symptoms: fever, cough and shortness of breath. . After you have been seen by a medical provider, you will be either: o Tested for (COVID-19) and discharged home on quarantine except to seek medical care if symptoms worsen, and asked to  - Stay home and avoid contact with others until you get your results (4-5 days)  - Avoid travel on public transportation if possible (such as bus, train, or airplane) or o Sent to the Emergency Department by EMS for evaluation, COVID-19 testing, and possible  admission depending on your condition and test results.  What to do if you are LOW RISK for COVID-19?  Reduce your risk of any infection by using the same precautions used for avoiding the common cold or flu:  . Wash your hands often with soap and warm water for at least 20 seconds.  If soap and water are not readily available, use an alcohol-based hand sanitizer with at least 60% alcohol.  . If coughing or sneezing, cover your mouth and nose by coughing or sneezing into the elbow areas of your shirt or coat, into a tissue or into your sleeve (not your hands). . Avoid shaking hands with others and consider head nods or verbal greetings only. . Avoid touching your eyes, nose, or mouth with unwashed hands.  . Avoid close contact with people who are Keir Foland. . Avoid places or events with large numbers of people in one location, like concerts or sporting events. . Carefully consider travel plans you have or are making. . If you are planning any travel outside or inside the [place], visit the CDC's Travelers' Health webpage for the latest health notices. . If you have some symptoms but not all symptoms, continue to monitor at home and seek medical attention if your symptoms worsen. . If you are having a medical emergency, call 911.  07/20/18 SCREENING NEG SLS ADDITIONAL HEALTHCARE OPTIONS FOR PATIENTS  [facility] / e-Visit: [email or web address]         [facility]: [phone]    [facility]: [phone]                   [facility]: [phone]  

## 2018-07-21 LAB — CBC WITH DIFFERENTIAL/PLATELET
Basophils Absolute: 0.1 10*3/uL (ref 0.0–0.2)
Basos: 1 %
EOS (ABSOLUTE): 0.4 10*3/uL (ref 0.0–0.4)
Eos: 4 %
Hematocrit: 37.6 % (ref 34.0–46.6)
Hemoglobin: 12.7 g/dL (ref 11.1–15.9)
Immature Grans (Abs): 0 10*3/uL (ref 0.0–0.1)
Immature Granulocytes: 0 %
Lymphocytes Absolute: 1.8 10*3/uL (ref 0.7–3.1)
Lymphs: 20 %
MCH: 30.4 pg (ref 26.6–33.0)
MCHC: 33.8 g/dL (ref 31.5–35.7)
MCV: 90 fL (ref 79–97)
Monocytes Absolute: 0.6 10*3/uL (ref 0.1–0.9)
Monocytes: 7 %
Neutrophils Absolute: 6 10*3/uL (ref 1.4–7.0)
Neutrophils: 68 %
Platelets: 245 10*3/uL (ref 150–450)
RBC: 4.18 x10E6/uL (ref 3.77–5.28)
RDW: 13.2 % (ref 11.7–15.4)
WBC: 8.9 10*3/uL (ref 3.4–10.8)

## 2018-07-21 LAB — COMPREHENSIVE METABOLIC PANEL
ALT: 29 IU/L (ref 0–32)
AST: 23 IU/L (ref 0–40)
Albumin/Globulin Ratio: 1.7 (ref 1.2–2.2)
Albumin: 4.1 g/dL (ref 3.8–4.8)
Alkaline Phosphatase: 92 IU/L (ref 39–117)
BUN/Creatinine Ratio: 22 (ref 9–23)
BUN: 13 mg/dL (ref 6–20)
Bilirubin Total: 0.2 mg/dL (ref 0.0–1.2)
CO2: 20 mmol/L (ref 20–29)
Calcium: 9.7 mg/dL (ref 8.7–10.2)
Chloride: 101 mmol/L (ref 96–106)
Creatinine, Ser: 0.6 mg/dL (ref 0.57–1.00)
GFR calc Af Amer: 138 mL/min/{1.73_m2} (ref >59)
GFR calc non Af Amer: 119 mL/min/{1.73_m2} (ref >59)
Globulin, Total: 2.4 g/dL (ref 1.5–4.5)
Glucose: 87 mg/dL (ref 65–99)
Potassium: 4.3 mmol/L (ref 3.5–5.2)
Sodium: 138 mmol/L (ref 134–144)
Total Protein: 6.5 g/dL (ref 6.0–8.5)

## 2018-07-21 LAB — PROTEIN / CREATININE RATIO, URINE
Creatinine, Urine: 14 mg/dL
Protein, Ur: 6 mg/dL
Protein/Creat Ratio: 429 mg/g creat — ABNORMAL HIGH (ref 0–200)

## 2018-07-23 ENCOUNTER — Other Ambulatory Visit: Payer: Self-pay

## 2018-07-23 ENCOUNTER — Ambulatory Visit (INDEPENDENT_AMBULATORY_CARE_PROVIDER_SITE_OTHER): Payer: 59 | Admitting: Certified Nurse Midwife

## 2018-07-23 ENCOUNTER — Encounter: Payer: Self-pay | Admitting: Certified Nurse Midwife

## 2018-07-23 VITALS — BP 123/91 | HR 91 | Ht 64.0 in | Wt 163.4 lb

## 2018-07-23 DIAGNOSIS — Z0130 Encounter for examination of blood pressure without abnormal findings: Secondary | ICD-10-CM | POA: Diagnosis not present

## 2018-07-23 NOTE — Progress Notes (Signed)
BP check, pt was seen on Friday and started on Labetalol 100 mg BID. She state she is feeling good. Denies any problems with headache , epigastric pain. States she feels great. She has been monitoring her BP with her on cuff and has some readings 150-140/80-90 with one elevated 164/80. Pt instructed to continue current dose and return on Friday for BP check. Red flag symptoms reviewed.   Doreene Burke, CNM

## 2018-07-23 NOTE — Patient Instructions (Signed)

## 2018-07-26 ENCOUNTER — Telehealth: Payer: Self-pay

## 2018-07-26 NOTE — Telephone Encounter (Signed)
Coronavirus (COVID-19) Are you at risk?  Are you at risk for the Coronavirus (COVID-19)?  To be considered HIGH RISK for Coronavirus (COVID-19), you have to meet the following criteria:  . Traveled to [place], [place], [place], [place] or [place]; or in the [place] to [place], [place], [place], or [place]; and have fever, cough, and shortness of breath within the last 2 weeks of travel OR . Been in close contact with a person diagnosed with COVID-19 within the last 2 weeks and have fever, cough, and shortness of breath . IF YOU DO NOT MEET THESE CRITERIA, YOU ARE CONSIDERED LOW RISK FOR COVID-19.  What to do if you are HIGH RISK for COVID-19?  . If you are having a medical emergency, call 911. . Seek medical care right away. Before you go to a doctor's office, urgent care or emergency department, call ahead and tell them about your recent travel, contact with someone diagnosed with COVID-19, and your symptoms. You should receive instructions from your physician's office regarding next steps of care.  . When you arrive at healthcare provider, tell the healthcare staff immediately you have returned from visiting [place], [place], [place], [place] or [place]; or traveled in the [place] to [place], [place], [place], or [place]; in the last two weeks or you have been in close contact with a person diagnosed with COVID-19 in the last 2 weeks.   . Tell the health care staff about your symptoms: fever, cough and shortness of breath. . After you have been seen by a medical provider, you will be either: o Tested for (COVID-19) and discharged home on quarantine except to seek medical care if symptoms worsen, and asked to  - Stay home and avoid contact with others until you get your results (4-5 days)  - Avoid travel on public transportation if possible (such as bus, train, or airplane) or o Sent to the Emergency Department by EMS for evaluation, COVID-19 testing, and possible  admission depending on your condition and test results.  What to do if you are LOW RISK for COVID-19?  Reduce your risk of any infection by using the same precautions used for avoiding the common cold or flu:  . Wash your hands often with soap and warm water for at least 20 seconds.  If soap and water are not readily available, use an alcohol-based hand sanitizer with at least 60% alcohol.  . If coughing or sneezing, cover your mouth and nose by coughing or sneezing into the elbow areas of your shirt or coat, into a tissue or into your sleeve (not your hands). . Avoid shaking hands with others and consider head nods or verbal greetings only. . Avoid touching your eyes, nose, or mouth with unwashed hands.  . Avoid close contact with people who are sick. . Avoid places or events with large numbers of people in one location, like concerts or sporting events. . Carefully consider travel plans you have or are making. . If you are planning any travel outside or inside the [place], visit the CDC's Travelers' Health webpage for the latest health notices. . If you have some symptoms but not all symptoms, continue to monitor at home and seek medical attention if your symptoms worsen. . If you are having a medical emergency, call 911.   ADDITIONAL HEALTHCARE OPTIONS FOR PATIENTS  [facility] / e-Visit: [email or web address]         [facility]: [phone]  [facility]: [phone]                   [facility]: [phone]   Pre-screen negative, DM.   

## 2018-07-27 ENCOUNTER — Ambulatory Visit (INDEPENDENT_AMBULATORY_CARE_PROVIDER_SITE_OTHER): Payer: 59 | Admitting: Certified Nurse Midwife

## 2018-07-27 ENCOUNTER — Other Ambulatory Visit: Payer: Self-pay

## 2018-07-27 ENCOUNTER — Encounter: Payer: Self-pay | Admitting: Certified Nurse Midwife

## 2018-07-27 VITALS — BP 120/86 | HR 84 | Ht 65.0 in | Wt 164.2 lb

## 2018-07-27 DIAGNOSIS — Z0130 Encounter for examination of blood pressure without abnormal findings: Secondary | ICD-10-CM

## 2018-07-27 NOTE — Progress Notes (Signed)
GYN ENCOUNTER NOTE  Subjective:       Taylor Kirk is a 33 y.o. G61P0101 female here for postpartum blood pressure check.   Taking 100 mg labetalol PO BID. No headache, blurred vision, epigastric pain, or abdominal swelling.   Denies difficulty breathing or respiratory distress, chest pain, abdominal pain, excessive vaginal bleeding, and dysuria.    Gynecologic History  Contraception: abstinence, recent birth.  Last Pap: 09/2017. Results were: Negative/Negative.  Obstetric History  OB History  Gravida Para Term Preterm AB Living  1 1 0 1 0 1  SAB TAB Ectopic Multiple Live Births  0 0 0 0 1    # Outcome Date GA Lbr Len/2nd Weight Sex Delivery Anes PTL Lv  1 Preterm 07/14/18 [redacted]w[redacted]d / 00:41 6 lb 5.6 oz (2.88 kg) M Vag-Spont EPI  LIV    Past Medical History:  Diagnosis Date  . Genital herpes     No past surgical history on file.  Current Outpatient Medications on File Prior to Visit  Medication Sig Dispense Refill  . ferrous sulfate 325 (65 FE) MG tablet Take 1 tablet (325 mg total) by mouth 2 (two) times daily with a meal. 60 tablet 3  . ibuprofen (ADVIL) 600 MG tablet Take 1 tablet (600 mg total) by mouth every 6 (six) hours. 30 tablet 0  . labetalol (NORMODYNE) 100 MG tablet Take 1 tablet (100 mg total) by mouth 2 (two) times daily. 60 tablet 1  . magnesium oxide (MAG-OX) 400 (241.3 Mg) MG tablet Take 1 tablet (400 mg total) by mouth daily. 30 tablet 3  . Prenatal Vit-Fe Fumarate-FA (PRENATAL MULTIVITAMIN) TABS tablet Take 1 tablet by mouth daily at 12 noon.    . vitamin C (ASCORBIC ACID) 500 MG tablet Take 500 mg by mouth daily.     No current facility-administered medications on file prior to visit.     Allergies  Allergen Reactions  . Sulfa Antibiotics Rash    Social History   Socioeconomic History  . Marital status: Married    Spouse name: Not on file  . Number of children: Not on file  . Years of education: Not on file  . Highest education level:  Not on file  Occupational History  . Occupation: Physical Therapist  Social Needs  . Financial resource strain: Not hard at all  . Food insecurity:    Worry: Never true    Inability: Never true  . Transportation needs:    Medical: No    Non-medical: No  Tobacco Use  . Smoking status: Never Smoker  . Smokeless tobacco: Never Used  Substance and Sexual Activity  . Alcohol use: Not Currently  . Drug use: Not Currently  . Sexual activity: Not Currently    Birth control/protection: None  Lifestyle  . Physical activity:    Days per week: Not on file    Minutes per session: Not on file  . Stress: Not on file  Relationships  . Social connections:    Talks on phone: Not on file    Gets together: Not on file    Attends religious service: Not on file    Active member of club or organization: Not on file    Attends meetings of clubs or organizations: Not on file    Relationship status: Married  . Intimate partner violence:    Fear of current or ex partner: Not on file    Emotionally abused: Not on file    Physically abused: Not on file  Forced sexual activity: Not on file  Other Topics Concern  . Not on file  Social History Narrative  . Not on file    Family History  Problem Relation Age of Onset  . Pancreatic cancer Paternal Grandfather   . Breast cancer Maternal Aunt   . Healthy Mother   . Healthy Father   . Ovarian cancer Neg Hx   . Colon cancer Neg Hx     The following portions of the patient's history were reviewed and updated as appropriate: allergies, current medications, past family history, past medical history, past social history, past surgical history and problem list.  Review of Systems  ROS negative except as noted above. Information obtained from patient.   Objective:   BP 120/86   Pulse 84   Ht 5\' 5"  (1.651 m)   Wt 164 lb 3.2 oz (74.5 kg)   Breastfeeding Yes   BMI 27.32 kg/m    CONSTITUTIONAL: Well-developed, well-nourished female in no acute  distress.  PHYSICAL EXAM: Not indicated.   Assessment:   1. Blood pressure check  Plan:   Continue medication as prescribed.   Reviewed red flag symptoms and when to call.   RTC for PPV or sooner if needed.    Gunnar BullaJenkins Michelle Bomani [place], NCM [facility] 07/27/18 5:17 PM

## 2018-07-27 NOTE — Progress Notes (Signed)
Patient here for blood pressure check, no HA's, visual changes or abnormal swelling.

## 2018-07-27 NOTE — Patient Instructions (Signed)
Postpartum Hypertension  Postpartum hypertension is high blood pressure that remains higher than normal after childbirth. You may not realize that you have postpartum hypertension if your blood pressure is not being checked regularly. In most cases, postpartum hypertension will go away on its own, usually within a week of delivery. However, for some women, medical treatment is required to prevent serious complications, such as seizures or stroke.  What are the causes?  This condition may be caused by one or more of the following:   Hypertension that existed before pregnancy (chronic hypertension).   Hypertension that comes on as a result of pregnancy (gestational hypertension).   Hypertensive disorders during pregnancy (preeclampsia) or seizures in women who have high blood pressure during pregnancy (eclampsia).   A condition in which the liver, platelets, and red blood cells are damaged during pregnancy (HELLP syndrome).   A condition in which the thyroid produces too much hormones (hyperthyroidism).   Other rare problems of the nerves (neurological disorders) or blood disorders.  In some cases, the cause may not be known.  What increases the risk?  The following factors may make you more likely to develop this condition:   Chronic hypertension. In some cases, this may not have been diagnosed before pregnancy.   Obesity.   Type 2 diabetes.   Kidney disease.   History of preeclampsia or eclampsia.   Other medical conditions that change the level of hormones in the body (hormonal imbalance).  What are the signs or symptoms?  As with all types of hypertension, postpartum hypertension may not have any symptoms. Depending on how high your blood pressure is, you may experience:   Headaches. These may be mild, moderate, or severe. They may also be steady, constant, or sudden in onset (thunderclap headache).   Changes in your ability to see (visual changes).   Dizziness.   Shortness of breath.   Swelling  of your hands, feet, lower legs, or face. In some cases, you may have swelling in more than one of these locations.   Heart palpitations or a racing heartbeat.   Difficulty breathing while lying down.   Decrease in the amount of urine that you pass.  Other rare signs and symptoms may include:   Sweating more than usual. This lasts longer than a few days after delivery.   Chest pain.   Sudden dizziness when you get up from sitting or lying down.   Seizures.   Nausea or vomiting.   Abdominal pain.  How is this diagnosed?  This condition may be diagnosed based on the results of a physical exam, blood pressure measurements, and blood and urine tests.  You may also have other tests, such as a CT scan or an MRI, to check for other problems of postpartum hypertension.  How is this treated?  If blood pressure is high enough to require treatment, your options may include:   Medicines to reduce blood pressure (antihypertensives). Tell your health care provider if you are breastfeeding or if you plan to breastfeed. There are many antihypertensive medicines that are safe to take while breastfeeding.   Stopping medicines that may be causing hypertension.   Treating medical conditions that are causing hypertension.   Treating the complications of hypertension, such as seizures, stroke, or kidney problems.  Your health care provider will also continue to monitor your blood pressure closely until it is within a safe range for you.  Follow these instructions at home:   Take over-the-counter and prescription medicines only as   told by your health care provider.   Return to your normal activities as told by your health care provider. Ask your health care provider what activities are safe for you.   Do not use any products that contain nicotine or tobacco, such as cigarettes and e-cigarettes. If you need help quitting, ask your health care provider.   Keep all follow-up visits as told by your health care provider. This  is important.  Contact a health care provider if:   Your symptoms get worse.   You have new symptoms, such as:  ? A headache that does not get better.  ? Dizziness.  ? Visual changes.  Get help right away if:   You suddenly develop swelling in your hands, ankles, or face.   You have sudden, rapid weight gain.   You develop difficulty breathing, chest pain, racing heartbeat, or heart palpitations.   You develop severe pain in your abdomen.   You have any symptoms of a stroke. "BE FAST" is an easy way to remember the main warning signs of a stroke:  ? B - Balance. Signs are dizziness, sudden trouble walking, or loss of balance.  ? E - Eyes. Signs are trouble seeing or a sudden change in vision.  ? F - Face. Signs are sudden weakness or numbness of the face, or the face or eyelid drooping on one side.  ? A - Arms. Signs are weakness or numbness in an arm. This happens suddenly and usually on one side of the body.  ? S - Speech. Signs are sudden trouble speaking, slurred speech, or trouble understanding what people say.  ? T - Time. Time to call emergency services. Write down what time symptoms started.   You have other signs of a stroke, such as:  ? A sudden, severe headache with no known cause.  ? Nausea or vomiting.  ? Seizure.  These symptoms may represent a serious problem that is an emergency. Do not wait to see if the symptoms will go away. Get medical help right away. Call your local emergency services (911 in the [place] Do not drive yourself to the hospital.  Summary   Postpartum hypertension is high blood pressure that remains higher than normal after childbirth.   In most cases, postpartum hypertension will go away on its own, usually within a week of delivery.   For some women, medical treatment is required to prevent serious complications, such as seizures or stroke.  This information is not intended to replace advice given to you by your health care provider. Make sure you discuss any questions  you have with your health care provider.  Document Released: 10/11/2013 Document Revised: 11/28/2016 Document Reviewed: 11/28/2016  Elsevier Interactive Patient Education  2019 Elsevier Inc.

## 2018-08-23 ENCOUNTER — Telehealth: Payer: Self-pay | Admitting: *Deleted

## 2018-08-23 NOTE — Telephone Encounter (Signed)
Coronavirus (COVID-19) Are you at risk?  Are you at risk for the Coronavirus (COVID-19)?  To be considered HIGH RISK for Coronavirus (COVID-19), you have to meet the following criteria:  . Traveled to [place], [place], [place], [place] or [place]; or in the [place] to [place], [place], [place], or [place]; and have fever, cough, and shortness of breath within the last 2 weeks of travel OR . Been in close contact with a person diagnosed with COVID-19 within the last 2 weeks and have fever, cough, and shortness of breath . IF YOU DO NOT MEET THESE CRITERIA, YOU ARE CONSIDERED LOW RISK FOR COVID-19.  What to do if you are HIGH RISK for COVID-19?  . If you are having a medical emergency, call 911. . Seek medical care right away. Before you go to a doctor's office, urgent care or emergency department, call ahead and tell them about your recent travel, contact with someone diagnosed with COVID-19, and your symptoms. You should receive instructions from your physician's office regarding next steps of care.  . When you arrive at healthcare provider, tell the healthcare staff immediately you have returned from visiting [place], [place], [place], [place] or [place]; or traveled in the [place] to [place], [place], [place], or [place]; in the last two weeks or you have been in close contact with a person diagnosed with COVID-19 in the last 2 weeks.   . Tell the health care staff about your symptoms: fever, cough and shortness of breath. . After you have been seen by a medical provider, you will be either: o Tested for (COVID-19) and discharged home on quarantine except to seek medical care if symptoms worsen, and asked to  - Stay home and avoid contact with others until you get your results (4-5 days)  - Avoid travel on public transportation if possible (such as bus, train, or airplane) or o Sent to the Emergency Department by EMS for evaluation, COVID-19 testing, and possible  admission depending on your condition and test results.  What to do if you are LOW RISK for COVID-19?  Reduce your risk of any infection by using the same precautions used for avoiding the common cold or flu:  . Wash your hands often with soap and warm water for at least 20 seconds.  If soap and water are not readily available, use an alcohol-based hand sanitizer with at least 60% alcohol.  . If coughing or sneezing, cover your mouth and nose by coughing or sneezing into the elbow areas of your shirt or coat, into a tissue or into your sleeve (not your hands). . Avoid shaking hands with others and consider head nods or verbal greetings only. . Avoid touching your eyes, nose, or mouth with unwashed hands.  . Avoid close contact with people who are sick. . Avoid places or events with large numbers of people in one location, like concerts or sporting events. . Carefully consider travel plans you have or are making. . If you are planning any travel outside or inside the [place], visit the CDC's Travelers' Health webpage for the latest health notices. . If you have some symptoms but not all symptoms, continue to monitor at home and seek medical attention if your symptoms worsen. . If you are having a medical emergency, call 911.   ADDITIONAL HEALTHCARE OPTIONS FOR PATIENTS  [facility] / e-Visit: [email or web address]         [facility]: [phone]  [facility]: [phone]                   [facility]: [phone]   Spoke with pt denies any sx.  Amy Clontz, CMA 

## 2018-08-27 ENCOUNTER — Other Ambulatory Visit: Payer: Self-pay

## 2018-08-27 ENCOUNTER — Encounter: Payer: Self-pay | Admitting: Certified Nurse Midwife

## 2018-08-27 ENCOUNTER — Ambulatory Visit (INDEPENDENT_AMBULATORY_CARE_PROVIDER_SITE_OTHER): Payer: 59 | Admitting: Certified Nurse Midwife

## 2018-08-27 DIAGNOSIS — Z8679 Personal history of other diseases of the circulatory system: Secondary | ICD-10-CM

## 2018-08-27 DIAGNOSIS — Z8759 Personal history of other complications of pregnancy, childbirth and the puerperium: Secondary | ICD-10-CM

## 2018-08-27 NOTE — Progress Notes (Signed)
Patient here for post partum care, no other complaints.

## 2018-08-27 NOTE — Patient Instructions (Signed)
Preventive Care 21-35 Years Old, Female Preventive care refers to visits with your health care provider and lifestyle choices that can promote health and wellness. This includes:  A yearly physical exam. This may also be called an annual well check.  Regular dental visits and eye exams.  Immunizations.  Screening for certain conditions.  Healthy lifestyle choices, such as eating a healthy diet, getting regular exercise, not using drugs or products that contain nicotine and tobacco, and limiting alcohol use. What can I expect for my preventive care visit? Physical exam Your health care provider will check your:  Height and weight. This may be used to calculate body mass index (BMI), which tells if you are at a healthy weight.  Heart rate and blood pressure.  Skin for abnormal spots. Counseling Your health care provider may ask you questions about your:  Alcohol, tobacco, and drug use.  Emotional well-being.  Home and relationship well-being.  Sexual activity.  Eating habits.  Work and work environment.  Method of birth control.  Menstrual cycle.  Pregnancy history. What immunizations do I need?  Influenza (flu) vaccine  This is recommended every year. Tetanus, diphtheria, and pertussis (Tdap) vaccine  You may need a Td booster every 10 years. Varicella (chickenpox) vaccine  You may need this if you have not been vaccinated. Human papillomavirus (HPV) vaccine  If recommended by your health care provider, you may need three doses over 6 months. Measles, mumps, and rubella (MMR) vaccine  You may need at least one dose of MMR. You may also need a second dose. Meningococcal conjugate (MenACWY) vaccine  One dose is recommended if you are age 19-21 years and a first-year college student living in a residence hall, or if you have one of several medical conditions. You may also need additional booster doses. Pneumococcal conjugate (PCV13) vaccine  You may need  this if you have certain conditions and were not previously vaccinated. Pneumococcal polysaccharide (PPSV23) vaccine  You may need one or two doses if you smoke cigarettes or if you have certain conditions. Hepatitis A vaccine  You may need this if you have certain conditions or if you travel or work in places where you may be exposed to hepatitis A. Hepatitis B vaccine  You may need this if you have certain conditions or if you travel or work in places where you may be exposed to hepatitis B. Haemophilus influenzae type b (Hib) vaccine  You may need this if you have certain conditions. You may receive vaccines as individual doses or as more than one vaccine together in one shot (combination vaccines). Talk with your health care provider about the risks and benefits of combination vaccines. What tests do I need?  Blood tests  Lipid and cholesterol levels. These may be checked every 5 years starting at age 20.  Hepatitis C test.  Hepatitis B test. Screening  Diabetes screening. This is done by checking your blood sugar (glucose) after you have not eaten for a while (fasting).  Sexually transmitted disease (STD) testing.  BRCA-related cancer screening. This may be done if you have a family history of breast, ovarian, tubal, or peritoneal cancers.  Pelvic exam and Pap test. This may be done every 3 years starting at age 21. Starting at age 30, this may be done every 5 years if you have a Pap test in combination with an HPV test. Talk with your health care provider about your test results, treatment options, and if necessary, the need for more tests.   Follow these instructions at home: Eating and drinking   Eat a diet that includes fresh fruits and vegetables, whole grains, lean protein, and low-fat dairy.  Take vitamin and mineral supplements as recommended by your health care provider.  Do not drink alcohol if: ? Your health care provider tells you not to drink. ? You are  pregnant, may be pregnant, or are planning to become pregnant.  If you drink alcohol: ? Limit how much you have to 0-1 drink a day. ? Be aware of how much alcohol is in your drink. In the [place] one drink equals one 12 oz bottle of beer (355 mL), one 5 oz glass of wine (148 mL), or one 1 oz glass of hard liquor (44 mL). Lifestyle  Take daily care of your teeth and gums.  Stay active. Exercise for at least 30 minutes on 5 or more days each week.  Do not use any products that contain nicotine or tobacco, such as cigarettes, e-cigarettes, and chewing tobacco. If you need help quitting, ask your health care provider.  If you are sexually active, practice safe sex. Use a condom or other form of birth control (contraception) in order to prevent pregnancy and STIs (sexually transmitted infections). If you plan to become pregnant, see your health care provider for a preconception visit. What's next?  Visit your health care provider once a year for a well check visit.  Ask your health care provider how often you should have your eyes and teeth checked.  Stay up to date on all vaccines. This information is not intended to replace advice given to you by your health care provider. Make sure you discuss any questions you have with your health care provider. Document Released: 04/05/2001 Document Revised: 10/19/2017 Document Reviewed: 10/19/2017 Elsevier Patient Education  2020 Elsevier Inc.  

## 2018-08-27 NOTE — Progress Notes (Signed)
Subjective:    Taylor Kirk is a 33 y.o. G58P0101 Caucasian female who presents for a postpartum visit. She is 6 weeks postpartum following a spontaneous vaginal delivery at 36+6 gestational weeks. Anesthesia: epidural. I have fully reviewed the prenatal and intrapartum course.   Postpartum course has been uncomplicated. Baby's course has been uncomplicated. Baby is feeding by breastfeeding with formula supplementation. Bleeding no bleeding. Bowel function is normal. Bladder function is normal.   Patient is not sexually active. Contraception method is condoms. Postpartum depression screening: negative. Score 1.  Last pap 09/2017 and was Negative/Negative.  Taking Labetalol twice daily for history of postpartum hypertension as well as iron and magnesium supplements.  Denies difficulty breathing or respiratory distress, chest pain, abdominal pain, excessive vaginal bleeding, dysuria, and leg pain or swelling.   The following portions of the patient's history were reviewed and updated as appropriate: allergies, current medications, past medical history, past surgical history and problem list.  Review of Systems  Pertinent items are noted in HPI.   Objective:   BP 106/72   Pulse 63   Ht 5\' 4"  (1.626 m)   Wt 169 lb (76.7 kg)   LMP  (LMP Unknown)   Breastfeeding Yes   BMI 29.01 kg/m   General:  alert, cooperative and no distress   Breasts:  deferred, no complaints  Lungs: clear to auscultation bilaterally  Heart:  regular rate and rhythm  Abdomen: soft, nontender   Vulva: normal  Vagina: normal vagina  Cervix:  closed  Corpus: Well-involuted  Adnexa:  Non-palpable       Depression screen [facility] 2/9 08/27/2018  Decreased Interest 0  Down, Depressed, Hopeless 0  PHQ - 2 Score 0  Altered sleeping 0  Tired, decreased energy 1  Change in appetite 0  Feeling bad or failure about yourself  0  Trouble concentrating 0  Moving slowly or fidgety/restless 0  Suicidal thoughts 0   PHQ-9 Score 1  Difficult doing work/chores Not difficult at all    Assessment:   Postpartum exam Six (6) wks s/p spontaneous vaginal birth Breastfeeding with formula supplementation Depression screening Contraception counseling   Plan:   Advised to stop Labetalol, monitor blood pressure daily, and send results via MyChart.   Labs today, see orders.   May return to work without restrictions.   Encouraged routine health maintenance techniques.   Reviewed red flag symptoms and when to call.   Follow up in: 3 months for ANNUAL EXAM or earlier if needed.   Diona Fanti, CNM [facility], [facility] 08/27/18 11:42 AM

## 2018-08-28 LAB — CBC
Hematocrit: 36.1 % (ref 34.0–46.6)
Hemoglobin: 12.3 g/dL (ref 11.1–15.9)
MCH: 30.1 pg (ref 26.6–33.0)
MCHC: 34.1 g/dL (ref 31.5–35.7)
MCV: 88 fL (ref 79–97)
Platelets: 225 10*3/uL (ref 150–450)
RBC: 4.09 x10E6/uL (ref 3.77–5.28)
RDW: 12.3 % (ref 11.7–15.4)
WBC: 5 10*3/uL (ref 3.4–10.8)

## 2018-08-28 LAB — MAGNESIUM: Magnesium: 1.9 mg/dL (ref 1.6–2.3)

## 2018-09-18 ENCOUNTER — Other Ambulatory Visit: Payer: Self-pay | Admitting: Certified Nurse Midwife

## 2018-09-24 ENCOUNTER — Other Ambulatory Visit: Payer: Self-pay

## 2018-09-24 MED ORDER — VALACYCLOVIR HCL 1 G PO TABS: 1000.0000 mg | ORAL_TABLET | ORAL | 1 refills | Status: DC | PRN

## 2018-10-18 ENCOUNTER — Encounter: Payer: 59 | Admitting: Certified Nurse Midwife

## 2018-12-18 ENCOUNTER — Other Ambulatory Visit: Payer: Self-pay

## 2018-12-18 ENCOUNTER — Encounter: Payer: Self-pay | Admitting: Certified Nurse Midwife

## 2018-12-18 ENCOUNTER — Ambulatory Visit (INDEPENDENT_AMBULATORY_CARE_PROVIDER_SITE_OTHER): Payer: 59 | Admitting: Certified Nurse Midwife

## 2018-12-18 VITALS — BP 125/85 | HR 77 | Ht 64.0 in | Wt 164.2 lb

## 2018-12-18 DIAGNOSIS — Z01419 Encounter for gynecological examination (general) (routine) without abnormal findings: Secondary | ICD-10-CM

## 2018-12-18 DIAGNOSIS — Z23 Encounter for immunization: Secondary | ICD-10-CM | POA: Diagnosis not present

## 2018-12-18 DIAGNOSIS — Z6828 Body mass index (BMI) 28.0-28.9, adult: Secondary | ICD-10-CM

## 2018-12-18 DIAGNOSIS — Z8679 Personal history of other diseases of the circulatory system: Secondary | ICD-10-CM

## 2018-12-18 DIAGNOSIS — Z8759 Personal history of other complications of pregnancy, childbirth and the puerperium: Secondary | ICD-10-CM

## 2018-12-18 NOTE — Progress Notes (Signed)
Patient here for annual exam. No complaints.  

## 2018-12-18 NOTE — Progress Notes (Signed)
ANNUAL PREVENTATIVE CARE GYN  ENCOUNTER NOTE  Subjective:       Taylor Kirk is a 33 y.o. G64P0101 female here for a routine annual gynecologic exam.  Questions daily medication dose of HSV suppression.   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   Denies difficulty breathing or respiratory distress, chest pain, abdominal pain, excessive vaginal bleeding, dysuria, and leg pain or swelling.     Gynecologic History  Patient's last menstrual period was 12/04/2018 (approximate). Period Cycle (Days): 28 Period Duration (Days): 5 Period Pattern: Regular Menstrual Flow: Moderate Menstrual Control: Tampon Dysmenorrhea: (!) Moderate Dysmenorrhea Symptoms: Cramping  Contraception: condoms  Last Pap: 09/2017. Results were: Negative/Negative  Obstetric History  OB History  Gravida Para Term Preterm AB Living  1 1 0 1 0 1  SAB TAB Ectopic Multiple Live Births  0 0 0 0 1    # Outcome Date GA Lbr Len/2nd Weight Sex Delivery Anes PTL Lv  1 Preterm 07/14/18 [redacted]w[redacted]d / 00:41 6 lb 5.6 oz (2.88 kg) M Vag-Spont EPI N LIV    Past Medical History:  Diagnosis Date  . Genital herpes     Current Outpatient Medications on File Prior to Visit  Medication Sig Dispense Refill  . Multiple Vitamins-Minerals (MULTIVITAMIN ADULT PO) Take 1 tablet by mouth daily.    . valACYclovir (VALTREX) 1000 MG tablet TAKE 1 TABLET BY MOUTH EVERY DAY 30 tablet 1  . vitamin C (ASCORBIC ACID) 500 MG tablet Take 500 mg by mouth daily.     No current facility-administered medications on file prior to visit.     Allergies  Allergen Reactions  . Sulfa Antibiotics Rash    Social History   Socioeconomic History  . Marital status: Married    Spouse name: Not on file  . Number of children: Not on file  . Years of education: Not on file  . Highest education level: Not on file  Occupational History  . Occupation: Physical Therapist  Social Needs  . Financial resource strain: Not hard at all   . Food insecurity    Worry: Never true    Inability: Never true  . Transportation needs    Medical: No    Non-medical: No  Tobacco Use  . Smoking status: Never Smoker  . Smokeless tobacco: Never Used  Substance and Sexual Activity  . Alcohol use: Yes    Comment: rare  . Drug use: Not Currently  . Sexual activity: Yes    Birth control/protection: Condom  Lifestyle  . Physical activity    Days per week: Not on file    Minutes per session: Not on file  . Stress: Not on file  Relationships  . Social Herbalist on phone: Not on file    Gets together: Not on file    Attends religious service: Not on file    Active member of club or organization: Not on file    Attends meetings of clubs or organizations: Not on file    Relationship status: Married  . Intimate partner violence    Fear of current or ex partner: Not on file    Emotionally abused: Not on file    Physically abused: Not on file    Forced sexual activity: Not on file  Other Topics Concern  . Not on file  Social History Narrative  . Not on file    Family History  Problem Relation Age of Onset  . Pancreatic cancer Paternal Grandfather   .  Breast cancer Maternal Aunt   . Healthy Mother   . Healthy Father   . Ovarian cancer Neg Hx   . Colon cancer Neg Hx     The following portions of the patient's history were reviewed and updated as appropriate: allergies, current medications, past family history, past medical history, past social history, past surgical history and problem list.  Review of Systems  ROS negative except as noted above. Information obtained from patient.    Objective:   BP 125/85   Pulse 77   Ht 5\' 4"  (1.626 m)   Wt 164 lb 3.2 oz (74.5 kg)   LMP 12/04/2018 (Approximate)   Breastfeeding No   BMI 28.18 kg/m    CONSTITUTIONAL: Well-developed, well-nourished female in no acute distress.   PSYCHIATRIC: Normal mood and affect. Normal behavior. Normal judgment and thought  content.  NEUROLGIC: Alert and oriented to person, place, and time. Normal muscle tone coordination. No cranial nerve deficit noted.  HENT:  Normocephalic, atraumatic, External right and left ear normal.   EYES: Conjunctivae and EOM are normal. Pupils are equal and round.   NECK: Normal range of motion, supple, no masses.  Normal thyroid.   SKIN: Skin is warm and dry. No rash noted. Not diaphoretic. No erythema. No pallor.  CARDIOVASCULAR: Normal heart rate noted, regular rhythm, no murmur.  RESPIRATORY: Clear to auscultation bilaterally. Effort and breath sounds normal, no problems with respiration noted.  BREASTS: Symmetric in size. No masses, skin changes, nipple drainage, or lymphadenopathy.  ABDOMEN: Soft, normal bowel sounds, no distention noted.  No tenderness, rebound or guarding.   PELVIC:  External Genitalia: Normal  Vagina: Normal  Cervix: Normal  Uterus: Normal  Adnexa: Normal  MUSCULOSKELETAL: Normal range of motion. No tenderness.  No cyanosis, clubbing, or edema.  2+ distal pulses.  LYMPHATIC: No Axillary, Supraclavicular, or Inguinal Adenopathy.  Assessment:   Annual gynecologic examination 33 y.o.   Contraception: condoms   Overweight   Problem List Items Addressed This Visit    None    Visit Diagnoses    Well woman exam    -  Primary   BMI 28.0-28.9,adult       History of postpartum hypertension          Plan:   Pap: Not needed  Labs: Declined   Routine preventative health maintenance measures emphasized: Exercise/Diet/Weight control, Tobacco Warnings, Alcohol/Substance use risks, Stress Management and Peer Pressure Issues; see AVS  Advised 500 mg Valtrex PO daily, contact if refill needed  Reviewed red flag symptoms and when to call  RTC x 1 year for ANNUAL EXAM or sooner if needed   04-03-1998, CNM [facility], [facility] 12/18/18 9:28 AM

## 2018-12-18 NOTE — Patient Instructions (Addendum)
Preventive Care 21-35 Years Old, Female Preventive care refers to visits with your health care provider and lifestyle choices that can promote health and wellness. This includes:  A yearly physical exam. This may also be called an annual well check.  Regular dental visits and eye exams.  Immunizations.  Screening for certain conditions.  Healthy lifestyle choices, such as eating a healthy diet, getting regular exercise, not using drugs or products that contain nicotine and tobacco, and limiting alcohol use. What can I expect for my preventive care visit? Physical exam Your health care provider will check your:  Height and weight. This may be used to calculate body mass index (BMI), which tells if you are at a healthy weight.  Heart rate and blood pressure.  Skin for abnormal spots. Counseling Your health care provider may ask you questions about your:  Alcohol, tobacco, and drug use.  Emotional well-being.  Home and relationship well-being.  Sexual activity.  Eating habits.  Work and work environment.  Method of birth control.  Menstrual cycle.  Pregnancy history. What immunizations do I need?  Influenza (flu) vaccine  This is recommended every year. Tetanus, diphtheria, and pertussis (Tdap) vaccine  You may need a Td booster every 10 years. Varicella (chickenpox) vaccine  You may need this if you have not been vaccinated. Human papillomavirus (HPV) vaccine  If recommended by your health care provider, you may need three doses over 6 months. Measles, mumps, and rubella (MMR) vaccine  You may need at least one dose of MMR. You may also need a second dose. Meningococcal conjugate (MenACWY) vaccine  One dose is recommended if you are age 19-21 years and a first-year college student living in a residence hall, or if you have one of several medical conditions. You may also need additional booster doses. Pneumococcal conjugate (PCV13) vaccine  You may need  this if you have certain conditions and were not previously vaccinated. Pneumococcal polysaccharide (PPSV23) vaccine  You may need one or two doses if you smoke cigarettes or if you have certain conditions. Hepatitis A vaccine  You may need this if you have certain conditions or if you travel or work in places where you may be exposed to hepatitis A. Hepatitis B vaccine  You may need this if you have certain conditions or if you travel or work in places where you may be exposed to hepatitis B. Haemophilus influenzae type b (Hib) vaccine  You may need this if you have certain conditions. You may receive vaccines as individual doses or as more than one vaccine together in one shot (combination vaccines). Talk with your health care provider about the risks and benefits of combination vaccines. What tests do I need?  Blood tests  Lipid and cholesterol levels. These may be checked every 5 years starting at age 20.  Hepatitis C test.  Hepatitis B test. Screening  Diabetes screening. This is done by checking your blood sugar (glucose) after you have not eaten for a while (fasting).  Sexually transmitted disease (STD) testing.  BRCA-related cancer screening. This may be done if you have a family history of breast, ovarian, tubal, or peritoneal cancers.  Pelvic exam and Pap test. This may be done every 3 years starting at age 21. Starting at age 30, this may be done every 5 years if you have a Pap test in combination with an HPV test. Talk with your health care provider about your test results, treatment options, and if necessary, the need for more tests.   Follow these instructions at home: Eating and drinking   Eat a diet that includes fresh fruits and vegetables, whole grains, lean protein, and low-fat dairy.  Take vitamin and mineral supplements as recommended by your health care provider.  Do not drink alcohol if: ? Your health care provider tells you not to drink. ? You are  pregnant, may be pregnant, or are planning to become pregnant.  If you drink alcohol: ? Limit how much you have to 0-1 drink a day. ? Be aware of how much alcohol is in your drink. In the [place] one drink equals one 12 oz bottle of beer (355 mL), one 5 oz glass of wine (148 mL), or one 1 oz glass of hard liquor (44 mL). Lifestyle  Take daily care of your teeth and gums.  Stay active. Exercise for at least 30 minutes on 5 or more days each week.  Do not use any products that contain nicotine or tobacco, such as cigarettes, e-cigarettes, and chewing tobacco. If you need help quitting, ask your health care provider.  If you are sexually active, practice safe sex. Use a condom or other form of birth control (contraception) in order to prevent pregnancy and STIs (sexually transmitted infections). If you plan to become pregnant, see your health care provider for a preconception visit. What's next?  Visit your health care provider once a year for a well check visit.  Ask your health care provider how often you should have your eyes and teeth checked.  Stay up to date on all vaccines. This information is not intended to replace advice given to you by your health care provider. Make sure you discuss any questions you have with your health care provider. Document Released: 04/05/2001 Document Revised: 10/19/2017 Document Reviewed: 10/19/2017 Elsevier Patient Education  [place].  Valacyclovir caplets What is this medicine? VALACYCLOVIR (val ay SYE kloe veer) is an antiviral medicine. It is used to treat or prevent infections caused by certain kinds of viruses. Examples of these infections include herpes and shingles. This medicine will not cure herpes. This medicine may be used for other purposes; ask your health care provider or pharmacist if you have questions. COMMON BRAND NAME(S): Valtrex What should I tell my health care provider before I take this medicine? They need to know if  you have any of these conditions:  acquired immunodeficiency syndrome (AIDS)  any other condition that may weaken the immune system  bone marrow or kidney transplant  kidney disease  an unusual or allergic reaction to valacyclovir, acyclovir, ganciclovir, valganciclovir, other medicines, foods, dyes, or preservatives  pregnant or trying to get pregnant  breast-feeding How should I use this medicine? Take this medicine by mouth with a glass of water. Follow the directions on the prescription label. You can take this medicine with or without food. Take your doses at regular intervals. Do not take your medicine more often than directed. Finish the full course prescribed by your doctor or health care professional even if you think your condition is better. Do not stop taking except on the advice of your doctor or health care professional. Talk to your pediatrician regarding the use of this medicine in children. While this drug may be prescribed for children as young as 2 years for selected conditions, precautions do apply. Overdosage: If you think you have taken too much of this medicine contact a poison control center or emergency room at once. NOTE: This medicine is only for you. Do not share this  medicine with others. What if I miss a dose? If you miss a dose, take it as soon as you can. If it is almost time for your next dose, take only that dose. Do not take double or extra doses. What may interact with this medicine? Do not take this medicine with any of the following medications:  cidofovir This medicine may also interact with the following medications:  adefovir  amphotericin B  certain antibiotics like amikacin, gentamicin, tobramycin, vancomycin  cimetidine  cisplatin  colistin  cyclosporine  foscarnet  lithium  methotrexate  probenecid  tacrolimus This list may not describe all possible interactions. Give your health care provider a list of all the medicines,  herbs, non-prescription drugs, or dietary supplements you use. Also tell them if you smoke, drink alcohol, or use illegal drugs. Some items may interact with your medicine. What should I watch for while using this medicine? Tell your doctor or health care professional if your symptoms do not start to get better after 1 week. This medicine works best when taken early in the course of an infection, within the first 14 hours. Begin treatment as soon as possible after the first signs of infection like tingling, itching, or pain in the affected area. It is possible that genital herpes may still be spread even when you are not having symptoms. Always use safer sex practices like condoms made of latex or polyurethane whenever you have sexual contact. You should stay well hydrated while taking this medicine. Drink plenty of fluids. What side effects may I notice from receiving this medicine? Side effects that you should report to your doctor or health care professional as soon as possible:  allergic reactions like skin rash, itching or hives, swelling of the face, lips, or tongue  aggressive behavior  confusion  hallucinations  problems with balance, talking, walking  stomach pain  tremor  trouble passing urine or change in the amount of urine Side effects that usually do not require medical attention (report to your doctor or health care professional if they continue or are bothersome):  dizziness  headache  nausea, vomiting This list may not describe all possible side effects. Call your doctor for medical advice about side effects. You may report side effects to FDA at 1-800-FDA-1088. Where should I keep my medicine? Keep out of the reach of children. Store at room temperature between 15 and 25 degrees C (59 and 77 degrees F). Keep container tightly closed. Throw away any unused medicine after the expiration date. NOTE: This sheet is a summary. It may not cover all possible information.  If you have questions about this medicine, talk to your doctor, pharmacist, or health care provider.  2020 Elsevier/Gold Standard (2018-03-06 12:22:33)

## 2019-06-08 ENCOUNTER — Emergency Department: Payer: 59

## 2019-06-08 ENCOUNTER — Other Ambulatory Visit: Payer: Self-pay

## 2019-06-08 ENCOUNTER — Emergency Department
Admission: EM | Admit: 2019-06-08 | Discharge: 2019-06-08 | Disposition: A | Discharge status: Home / Self Care | Payer: 59 | Attending: Emergency Medicine | Admitting: Emergency Medicine

## 2019-06-08 DIAGNOSIS — R079 Chest pain, unspecified: Secondary | ICD-10-CM

## 2019-06-08 DIAGNOSIS — Z79899 Other long term (current) drug therapy: Secondary | ICD-10-CM | POA: Diagnosis not present

## 2019-06-08 DIAGNOSIS — R0789 Other chest pain: Secondary | ICD-10-CM | POA: Diagnosis present

## 2019-06-08 LAB — FIBRIN DERIVATIVES D-DIMER (ARMC ONLY): Fibrin derivatives D-dimer (ARMC): 355.82 ng/mL (FEU) (ref 0.00–499.00)

## 2019-06-08 LAB — BASIC METABOLIC PANEL
Anion gap: 12 (ref 5–15)
BUN: 12 mg/dL (ref 6–20)
CO2: 21 mmol/L — ABNORMAL LOW (ref 22–32)
Calcium: 9 mg/dL (ref 8.9–10.3)
Chloride: 103 mmol/L (ref 98–111)
Creatinine, Ser: 0.69 mg/dL (ref 0.44–1.00)
GFR calc Af Amer: >60 mL/min (ref >60)
GFR calc non Af Amer: >60 mL/min (ref >60)
Glucose, Bld: 112 mg/dL — ABNORMAL HIGH (ref 70–99)
Potassium: 2.8 mmol/L — ABNORMAL LOW (ref 3.5–5.1)
Sodium: 136 mmol/L (ref 135–145)

## 2019-06-08 LAB — CBC
HCT: 40.8 % (ref 36.0–46.0)
Hemoglobin: 13.9 g/dL (ref 12.0–15.0)
MCH: 30 pg (ref 26.0–34.0)
MCHC: 34.1 g/dL (ref 30.0–36.0)
MCV: 88.1 fL (ref 80.0–100.0)
Platelets: 243 10*3/uL (ref 150–400)
RBC: 4.63 MIL/uL (ref 3.87–5.11)
RDW: 12.5 % (ref 11.5–15.5)
WBC: 9.4 10*3/uL (ref 4.0–10.5)
nRBC: 0 % (ref 0.0–0.2)

## 2019-06-08 LAB — TROPONIN I (HIGH SENSITIVITY)
Troponin I (High Sensitivity): <2 ng/L (ref <18)
Troponin I (High Sensitivity): 3 ng/L (ref <18)

## 2019-06-08 NOTE — ED Provider Notes (Signed)
[facility] Emergency Department Provider Note  ____________________________________________  Time seen: Approximately 5:18 PM  I have reviewed the triage vital signs and the nursing notes.   HISTORY  Chief Complaint Chest Pain    HPI Taylor Kirk is a 34 y.o. female with no significant past medical history who reports being in her usual state of health until she is awake in the middle night by her 35-year-old child who woke up.  On awakening, she notes having some left anterior chest pain that felt sharp and worse with arm movement and deep breathing, nonradiating.  No shortness of breath diaphoresis vomiting palpitations or syncope.  This morning, the pain was persistent and she started to feel a little lightheaded , so she comes to the ED for evaluation.  She reports the pain is currently 1/10.  Is worse with movement and stretching of the left shoulder.  Still denies shortness of breath.  No fever.  Eating and drinking normally, sleeping well regularly.  Denies any history of DVT or PE, no recent travel trauma hospitalization or surgery.  Takes no medications except for multivitamin, no exogenous hormones.     Past Medical History:  Diagnosis Date  . Genital herpes      Patient Active Problem List   Diagnosis Date Noted  . History of premature rupture of membranes 07/14/2018  . History of gestational hypertension 07/14/2018     History reviewed. No pertinent surgical history.   Prior to Admission medications   Medication Sig Start Date End Date Taking? Authorizing Provider  Multiple Vitamins-Minerals (MULTIVITAMIN ADULT PO) Take 1 tablet by mouth daily.    [provider]  valACYclovir (VALTREX) 1000 MG tablet TAKE 1 TABLET BY MOUTH EVERY DAY 09/24/18   Doreene Burke, CNM  vitamin C (ASCORBIC ACID) 500 MG tablet Take 500 mg by mouth daily.    [provider]     Allergies Sulfa antibiotics   Family History   Problem Relation Age of Onset  . Pancreatic cancer Paternal Grandfather   . Breast cancer Maternal Aunt   . Healthy Mother   . Healthy Father   . Ovarian cancer Neg Hx   . Colon cancer Neg Hx     Social History Social History   Tobacco Use  . Smoking status: Never Smoker  . Smokeless tobacco: Never Used  Substance Use Topics  . Alcohol use: Yes    Comment: rare  . Drug use: Not Currently    Review of Systems  Constitutional:   No fever or chills.  ENT:   No sore throat. No rhinorrhea. Cardiovascular:   Positive chest pain as above without syncope. Respiratory:   No dyspnea or cough. Gastrointestinal:   Negative for abdominal pain, vomiting and diarrhea.  Musculoskeletal:   Negative for focal pain or swelling All other systems reviewed and are negative except as documented above in ROS and HPI.  ____________________________________________   PHYSICAL EXAM:  VITAL SIGNS: ED Triage Vitals  Enc Vitals Group     BP 06/08/19 1315 (!) 171/95     Pulse Rate 06/08/19 1315 (!) 131     Resp 06/08/19 1315 18     Temp 06/08/19 1315 98.8 F (37.1 C)     Temp Source 06/08/19 1315 Oral     SpO2 06/08/19 1315 100 %     Weight 06/08/19 1329 165 lb (74.8 kg)     Height 06/08/19 1329 5\' 4"  (1.626 m)     Head Circumference --  Peak Flow --      Pain Score 06/08/19 1329 2     Pain Loc --      Pain Edu? --      Excl. in [place]? --     Vital signs reviewed, nursing assessments reviewed.   Constitutional:   Alert and oriented. Non-toxic appearance. Eyes:   Conjunctivae are normal. EOMI.  ENT      Head:   Normocephalic and atraumatic.      Nose:   Wearing a mask.      Mouth/Throat:   Wearing a mask.      Neck:   No meningismus. Full ROM. Hematological/Lymphatic/Immunilogical:   No cervical lymphadenopathy. Cardiovascular:   Tachycardia heart rate 100. Symmetric bilateral radial and DP pulses.  No murmurs. Cap refill less than 2 seconds. Respiratory:   Normal respiratory  effort without tachypnea/retractions. Breath sounds are clear and equal bilaterally. No wheezes/rales/rhonchi. Musculoskeletal:   Normal range of motion in all extremities. No joint effusions.  No lower extremity tenderness.  No edema.  No chest wall tenderness, no pain with muscle stress of the left shoulder girdle and chest wall. Neurologic:   Normal speech and language.  Motor grossly intact. No acute focal neurologic deficits are appreciated.  Skin:    Skin is warm, dry and intact. No rash noted.  No petechiae, purpura, or bullae.  ____________________________________________    LABS (pertinent positives/negatives) (all labs ordered are listed, but only abnormal results are displayed) Labs Reviewed  BASIC METABOLIC PANEL - Abnormal; Notable for the following components:      Result Value   Potassium 2.8 (*)    CO2 21 (*)    Glucose, Bld 112 (*)    All other components within normal limits  CBC  FIBRIN DERIVATIVES D-DIMER ([facility] ONLY)  POC URINE PREG, ED  TROPONIN I (HIGH SENSITIVITY)  TROPONIN I (HIGH SENSITIVITY)   ____________________________________________   EKG  Interpreted by me Sinus tachycardia rate 106.  Normal axis and intervals.  Normal QRS ST segments and T waves.  No evidence of right heart strain.  ____________________________________________    RADIOLOGY  DG Chest 2 View  Result Date: 06/08/2019 CLINICAL DATA:  Chest pain. EXAM: CHEST - 2 VIEW COMPARISON:  None. FINDINGS: The heart size and mediastinal contours are within normal limits. Both lungs are clear. No pneumothorax or pleural effusion is noted. The visualized skeletal structures are unremarkable. IMPRESSION: No active cardiopulmonary disease. Electronically Signed   By: Marijo Conception M.D.   On: 06/08/2019 14:02    ____________________________________________   PROCEDURES Procedures  ____________________________________________  DIFFERENTIAL DIAGNOSIS   Anxiety, musculoskeletal chest  wall pain, pulmonary believes him  CLINICAL IMPRESSION / ASSESSMENT AND PLAN / ED COURSE  Medications ordered in the ED: Medications - No data to display  Pertinent labs & imaging results that were available during my care of the patient were reviewed by me and considered in my medical decision making (see chart for details).  Taylor Kirk was evaluated in Emergency Department on 06/08/2019 for the symptoms described in the history of present illness. She was evaluated in the context of the global COVID-19 pandemic, which necessitated consideration that the patient might be at risk for infection with the SARS-CoV-2 virus that causes COVID-19. Institutional protocols and algorithms that pertain to the evaluation of patients at risk for COVID-19 are in a state of rapid change based on information released by regulatory bodies including the CDC and federal and state organizations. These policies and  algorithms were followed during the patient's care in the ED.   Patient presents with atypical chest pain, most likely musculoskeletal versus anxiety related.  On my exam I am unable to reproduce the pain, and with tachycardia and somewhat pleuritic nature, I will obtain D-dimer to further risk stratify.  If negative I think patient can be discharged home for conservative therapy with NSAIDs, gentle stretching.  Clinical Course as of Jun 08 1847  Sat Jun 08, 2019  1848 D dimer low, repeat trop normal. Stable for Dc.    [PS]    Clinical Course User Index [PS] Sharman Cheek, MD     ____________________________________________   FINAL CLINICAL IMPRESSION(S) / ED DIAGNOSES    Final diagnoses:  Nonspecific chest pain     ED Discharge Orders    None      Portions of this note were generated with dragon dictation software. Dictation errors may occur despite best attempts at proofreading.   Sharman Cheek, MD 06/08/19 [phone]

## 2019-06-08 NOTE — Discharge Instructions (Addendum)
Your lab tests, EKG, and Chest xray were all okay today. Try using a heating pad on the painful area and take anti-inflammatory medicine such as ibuprofen or aleve for the next 24 hours to see if the pain resolves.

## 2019-06-08 NOTE — ED Notes (Signed)
Pt reports that she started with chest pain during the night - pain is on left side of chest and does not radiate - movement/stretching produces the pain - pt reports that she started with dizziness and flushed feeling around 1030am and decided to come to the ED for eval - at this time no pain - denies dizziness/SHOB at this time

## 2019-06-08 NOTE — ED Triage Notes (Signed)
Pt c/o of central CP that began during night with lightheadedness. C/o BP was high (190/100 at home. 150/90 later). HR was 90-100 at home which pt states is high for her. A&O, ambulatory.

## 2019-12-19 ENCOUNTER — Other Ambulatory Visit: Payer: Self-pay

## 2019-12-19 ENCOUNTER — Ambulatory Visit (INDEPENDENT_AMBULATORY_CARE_PROVIDER_SITE_OTHER): Payer: 59 | Admitting: Certified Nurse Midwife

## 2019-12-19 ENCOUNTER — Encounter: Payer: Self-pay | Admitting: Certified Nurse Midwife

## 2019-12-19 VITALS — BP 122/80 | HR 85 | Ht 65.0 in | Wt 155.4 lb

## 2019-12-19 DIAGNOSIS — Z319 Encounter for procreative management, unspecified: Secondary | ICD-10-CM

## 2019-12-19 DIAGNOSIS — Z01419 Encounter for gynecological examination (general) (routine) without abnormal findings: Secondary | ICD-10-CM | POA: Diagnosis not present

## 2019-12-19 DIAGNOSIS — Z23 Encounter for immunization: Secondary | ICD-10-CM | POA: Diagnosis not present

## 2019-12-19 NOTE — Progress Notes (Signed)
ANNUAL PREVENTATIVE CARE GYN  ENCOUNTER NOTE  Subjective:       Taylor Kirk is a 34 y.o. G45P0101 female here for a routine annual gynecologic exam.    Denies difficulty breathing or respiratory distress, chest pain, abdominal pain, excessive vaginal bleeding, dysuria, and leg pain or swelling.    Gynecologic History  Patient's last menstrual period was 12/03/2019 (approximate). Period Cycle (Days): 28 Period Duration (Days): 5 Period Pattern: Regular Menstrual Flow: Moderate, Light Menstrual Control: Tampon Menstrual Control Change Freq (Hours): 3-4 Dysmenorrhea: (!) Mild Dysmenorrhea Symptoms: Cramping   Upstream - 12/19/19 0824      Pregnancy Intention Screening   Does the patient want to become pregnant in the next year? Yes    Does the patient's partner want to become pregnant in the next year? Yes    Would the patient like to discuss contraceptive options today? No          The pregnancy intention screening data noted above was reviewed. Potential methods of contraception were discussed. The patient elected to proceed with Female Condom and Withdrawal or Other Method.   Last Pap: 09/2017. Results were: Neg/Neg  Obstetric History  OB History  Gravida Para Term Preterm AB Living  1 1 0 1 0 1  SAB TAB Ectopic Multiple Live Births  0 0 0 0 1    # Outcome Date GA Lbr Len/2nd Weight Sex Delivery Anes PTL Lv  1 Preterm 07/14/18 [redacted]w[redacted]d / 00:41 6 lb 5.6 oz (2.88 kg) M Vag-Spont EPI N LIV    Past Medical History:  Diagnosis Date  . Genital herpes     History reviewed. No pertinent surgical history.  Current Outpatient Medications on File Prior to Visit  Medication Sig Dispense Refill  . Multiple Vitamins-Minerals (MULTIVITAMIN ADULT PO) Take 1 tablet by mouth daily.    . valACYclovir (VALTREX) 1000 MG tablet TAKE 1 TABLET BY MOUTH EVERY DAY 30 tablet 1  . vitamin C (ASCORBIC ACID) 500 MG tablet Take 500 mg by mouth daily.     No current  facility-administered medications on file prior to visit.    Allergies  Allergen Reactions  . Sulfa Antibiotics Rash    Social History   Socioeconomic History  . Marital status: Married    Spouse name: Not on file  . Number of children: Not on file  . Years of education: Not on file  . Highest education level: Not on file  Occupational History  . Occupation: Physical Therapist  Tobacco Use  . Smoking status: Never Smoker  . Smokeless tobacco: Never Used  Vaping Use  . Vaping Use: Never used  Substance and Sexual Activity  . Alcohol use: Yes    Comment: socially  . Drug use: Not Currently  . Sexual activity: Yes    Birth control/protection: Condom, Coitus interruptus  Other Topics Concern  . Not on file  Social History Narrative  . Not on file   Social Determinants of Health   Financial Resource Strain:   . Difficulty of Paying Living Expenses: Not on file  Food Insecurity:   . Worried About Programme researcher, broadcasting/film/video in the Last Year: Not on file  . Ran Out of Food in the Last Year: Not on file  Transportation Needs:   . Lack of Transportation (Medical): Not on file  . Lack of Transportation (Non-Medical): Not on file  Physical Activity:   . Days of Exercise per Week: Not on file  . Minutes of Exercise per  Session: Not on file  Stress:   . Feeling of Stress : Not on file  Social Connections:   . Frequency of Communication with Friends and Family: Not on file  . Frequency of Social Gatherings with Friends and Family: Not on file  . Attends Religious Services: Not on file  . Active Member of Clubs or Organizations: Not on file  . Attends Banker Meetings: Not on file  . Marital Status: Not on file  Intimate Partner Violence:   . Fear of Current or Ex-Partner: Not on file  . Emotionally Abused: Not on file  . Physically Abused: Not on file  . Sexually Abused: Not on file    Family History  Problem Relation Age of Onset  . Pancreatic cancer Paternal  Grandfather   . Breast cancer Maternal Aunt   . Healthy Mother   . Healthy Father   . Ovarian cancer Neg Hx   . Colon cancer Neg Hx     The following portions of the patient's history were reviewed and updated as appropriate: allergies, current medications, past family history, past medical history, past social history, past surgical history and problem list.  Review of Systems  ROS negative except as noted above. Information obtained from patient.    Objective:   BP 122/80   Pulse 85   Ht 5\' 5"  (1.651 m)   Wt 155 lb 6.4 oz (70.5 kg)   LMP 12/03/2019 (Approximate)   Breastfeeding No   BMI 25.86 kg/m   CONSTITUTIONAL: Well-developed, well-nourished female in no acute distress.   PSYCHIATRIC: Normal mood and affect. Normal behavior. Normal  judgment and thought content.  NEUROLGIC: Alert and oriented to person, place, and time. Normal muscle tone coordination. No cranial nerve deficit noted.  HENT:  Normocephalic, atraumatic, External right and left ear normal.   EYES: Conjunctivae and EOM are normal. Pupils are equal and round.   NECK: Normal range of motion, supple, no masses.  Normal thyroid.   SKIN: Skin is warm and dry. No rash noted. Not diaphoretic. No erythema. No pallor. Professional tattoos present.   CARDIOVASCULAR: Normal heart rate noted, regular rhythm, no murmur.  RESPIRATORY: Clear to auscultation bilaterally. Effort and breath sounds normal, no problems with respiration noted.  BREASTS: Symmetric in size. No masses, skin changes, nipple drainage, or lymphadenopathy.  ABDOMEN: Soft, normal bowel sounds, no distention noted.  No tenderness, rebound or guarding.   PELVIC:  External Genitalia: Normal  Vagina: Normal  Cervix: Normal  Uterus: Normal  Adnexa: Normal  MUSCULOSKELETAL: Normal range of motion. No tenderness.  No cyanosis, clubbing, or edema.  2+ distal pulses.  LYMPHATIC: No Axillary, Supraclavicular, or Inguinal  Adenopathy.  Assessment:   Annual gynecologic examination 34 y.o.   Contraception: coitus interruptus and condoms   Normal BMI   Problem List Items Addressed This Visit    None    Visit Diagnoses    Well woman exam    -  Primary   Need for influenza vaccination       Patient desires pregnancy          Plan:   Pap: Not needed  Labs: Declined  Routine preventative health maintenance measures emphasized: Preparing for pregnancy, Exercise/Diet/Weight control, Tobacco Warnings, Alcohol/Substance use risks and Stress Management; see AVS  Reviewed red flag symptoms and when to call  Return to Clinic - 1 Year for 31 or sooner if needed   [employer], CNM  [facility], [facility] 12/19/19 12:14 PM

## 2019-12-19 NOTE — Patient Instructions (Signed)
Preparing for Pregnancy If you are considering becoming pregnant, make an appointment to see your regular health care provider to learn how to prepare for a safe and healthy pregnancy (preconception care). During a preconception care visit, your health care provider will:  Do a complete physical exam, including a Pap test.  Take a complete medical history.  Give you information, answer your questions, and help you resolve problems. Preconception checklist Medical history  Tell your health care provider about any current or past medical conditions. Your pregnancy or your ability to become pregnant may be affected by chronic conditions, such as diabetes, chronic hypertension, and thyroid problems.  Include your family's medical history as well as your partner's medical history.  Tell your health care provider about any history of STIs (sexually transmitted infections).These can affect your pregnancy. In some cases, they can be passed to your baby. Discuss any concerns that you have about STIs.  If indicated, discuss the benefits of genetic testing. This testing will show whether there are any genetic conditions that may be passed from you or your partner to your baby.  Tell your health care provider about: ? Any problems you have had with conception or pregnancy. ? Any medicines you take. These include vitamins, herbal supplements, and over-the-counter medicines. ? Your history of immunizations. Discuss any vaccinations that you may need. Diet  Ask your health care provider what to include in a healthy diet that has a balance of nutrients. This is especially important when you are pregnant or preparing to become pregnant.  Ask your health care provider to help you reach a healthy weight before pregnancy. ? If you are overweight, you may be at higher risk for certain complications, such as high blood pressure, diabetes, and preterm birth. ? If you are underweight, you are more likely to  have a baby who has a low birth weight. Lifestyle, work, and home  Let your health care provider know: ? About any lifestyle habits that you have, such as alcohol use, drug use, or smoking. ? About recreational activities that may put you at risk during pregnancy, such as downhill skiing and certain exercise programs. ? Tell your health care provider about any international travel, especially any travel to places with an active [place] virus outbreak. ? About harmful substances that you may be exposed to at work or at home. These include chemicals, pesticides, radiation, or even litter boxes. ? If you do not feel safe at home. Mental health  Tell your health care provider about: ? Any history of mental health conditions, including feelings of depression, sadness, or anxiety. ? Any medicines that you take for a mental health condition. These include herbs and supplements. Home instructions to prepare for pregnancy Lifestyle   Eat a balanced diet. This includes fresh fruits and vegetables, whole grains, lean meats, low-fat dairy products, healthy fats, and foods that are high in fiber. Ask to meet with a nutritionist or registered dietitian for assistance with meal planning and goals.  Get regular exercise. Try to be active for at least 30 minutes a day on most days of the week. Ask your health care provider which activities are safe during pregnancy.  Do not use any products that contain nicotine or tobacco, such as cigarettes and e-cigarettes. If you need help quitting, ask your health care provider.  Do not drink alcohol.  Do not take illegal drugs.  Maintain a healthy weight. Ask your health care provider what weight range is right for you. General  instructions  Keep an accurate record of your menstrual periods. This makes it easier for your health care provider to determine your baby's due date.  Begin taking prenatal vitamins and folic acid supplements daily as directed by your  health care provider.  Manage any chronic conditions, such as high blood pressure and diabetes, as told by your health care provider. This is important. How do I know that I am pregnant? You may be pregnant if you have been sexually active and you miss your period. Symptoms of early pregnancy include:  Mild cramping.  Very light vaginal bleeding (spotting).  Feeling unusually tired.  Nausea and vomiting (morning sickness). If you have any of these symptoms and you suspect that you might be pregnant, you can take a home pregnancy test. These tests check for a hormone in your urine (human chorionic gonadotropin, or hCG). A woman's body begins to make this hormone during early pregnancy. These tests are very accurate. Wait until at least the first day after you miss your period to take one. If the test shows that you are pregnant (you get a positive result), call your health care provider to make an appointment for prenatal care. What should I do if I become pregnant?      Make an appointment with your health care provider as soon as you suspect you are pregnant.  Do not use any products that contain nicotine, such as cigarettes, chewing tobacco, and e-cigarettes. If you need help quitting, ask your health care provider.  Do not drink alcoholic beverages. Alcohol is related to a number of birth defects.  Avoid toxic odors and chemicals.  You may continue to have sexual intercourse if it does not cause pain or other problems, such as vaginal bleeding. This information is not intended to replace advice given to you by your health care provider. Make sure you discuss any questions you have with your health care provider. Document Revised: 02/09/2017 Document Reviewed: 08/30/2015 Elsevier Patient Education  2020 [place] 59-72 Years Old, Female Preventive care refers to visits with your health care provider and lifestyle choices that can promote health and wellness.  This includes:  A yearly physical exam. This may also be called an annual well check.  Regular dental visits and eye exams.  Immunizations.  Screening for certain conditions.  Healthy lifestyle choices, such as eating a healthy diet, getting regular exercise, not using drugs or products that contain nicotine and tobacco, and limiting alcohol use. What can I expect for my preventive care visit? Physical exam Your health care provider will check your:  Height and weight. This may be used to calculate body mass index (BMI), which tells if you are at a healthy weight.  Heart rate and blood pressure.  Skin for abnormal spots. Counseling Your health care provider may ask you questions about your:  Alcohol, tobacco, and drug use.  Emotional well-being.  Home and relationship well-being.  Sexual activity.  Eating habits.  Work and work Statistician.  Method of birth control.  Menstrual cycle.  Pregnancy history. What immunizations do I need?  Influenza (flu) vaccine  This is recommended every year. Tetanus, diphtheria, and pertussis (Tdap) vaccine  You may need a Td booster every 10 years. Varicella (chickenpox) vaccine  You may need this if you have not been vaccinated. Human papillomavirus (HPV) vaccine  If recommended by your health care provider, you may need three doses over 6 months. Measles, mumps, and rubella (MMR) vaccine  You  may need at least one dose of MMR. You may also need a second dose. Meningococcal conjugate (MenACWY) vaccine  One dose is recommended if you are age 52-21 years and a first-year college student living in a residence hall, or if you have one of several medical conditions. You may also need additional booster doses. Pneumococcal conjugate (PCV13) vaccine  You may need this if you have certain conditions and were not previously vaccinated. Pneumococcal polysaccharide (PPSV23) vaccine  You may need one or two doses if you smoke  cigarettes or if you have certain conditions. Hepatitis A vaccine  You may need this if you have certain conditions or if you travel or work in places where you may be exposed to hepatitis A. Hepatitis B vaccine  You may need this if you have certain conditions or if you travel or work in places where you may be exposed to hepatitis B. Haemophilus influenzae type b (Hib) vaccine  You may need this if you have certain conditions. You may receive vaccines as individual doses or as more than one vaccine together in one shot (combination vaccines). Talk with your health care provider about the risks and benefits of combination vaccines. What tests do I need?  Blood tests  Lipid and cholesterol levels. These may be checked every 5 years starting at age 29.  Hepatitis C test.  Hepatitis B test. Screening  Diabetes screening. This is done by checking your blood sugar (glucose) after you have not eaten for a while (fasting).  Sexually transmitted disease (STD) testing.  BRCA-related cancer screening. This may be done if you have a family history of breast, ovarian, tubal, or peritoneal cancers.  Pelvic exam and Pap test. This may be done every 3 years starting at age 59. Starting at age 52, this may be done every 5 years if you have a Pap test in combination with an HPV test. Talk with your health care provider about your test results, treatment options, and if necessary, the need for more tests. Follow these instructions at home: Eating and drinking   Eat a diet that includes fresh fruits and vegetables, whole grains, lean protein, and low-fat dairy.  Take vitamin and mineral supplements as recommended by your health care provider.  Do not drink alcohol if: ? Your health care provider tells you not to drink. ? You are pregnant, may be pregnant, or are planning to become pregnant.  If you drink alcohol: ? Limit how much you have to 0-1 drink a day. ? Be aware of how much alcohol  is in your drink. In the [place] one drink equals one 12 oz bottle of beer (355 mL), one 5 oz glass of wine (148 mL), or one 1 oz glass of hard liquor (44 mL). Lifestyle  Take daily care of your teeth and gums.  Stay active. Exercise for at least 30 minutes on 5 or more days each week.  Do not use any products that contain nicotine or tobacco, such as cigarettes, e-cigarettes, and chewing tobacco. If you need help quitting, ask your health care provider.  If you are sexually active, practice safe sex. Use a condom or other form of birth control (contraception) in order to prevent pregnancy and STIs (sexually transmitted infections). If you plan to become pregnant, see your health care provider for a preconception visit. What's next?  Visit your health care provider once a year for a well check visit.  Ask your health care provider how often you should have your  eyes and teeth checked.  Stay up to date on all vaccines. This information is not intended to replace advice given to you by your health care provider. Make sure you discuss any questions you have with your health care provider. Document Revised: 10/19/2017 Document Reviewed: 10/19/2017 Elsevier Patient Education  2020 [employer].

## 2020-12-21 ENCOUNTER — Encounter: Payer: 59 | Admitting: Certified Nurse Midwife

## 2021-10-13 IMAGING — CR DG CHEST 2V
1 series · 2 of 2 positions shown · non-contrast
Comparison: None.

CLINICAL DATA: Chest pain.

EXAM:
CHEST - 2 VIEW

[Series 1: w chest pa · 0.14mm/px · 2 of 2 slices shown]
[im 1/2]
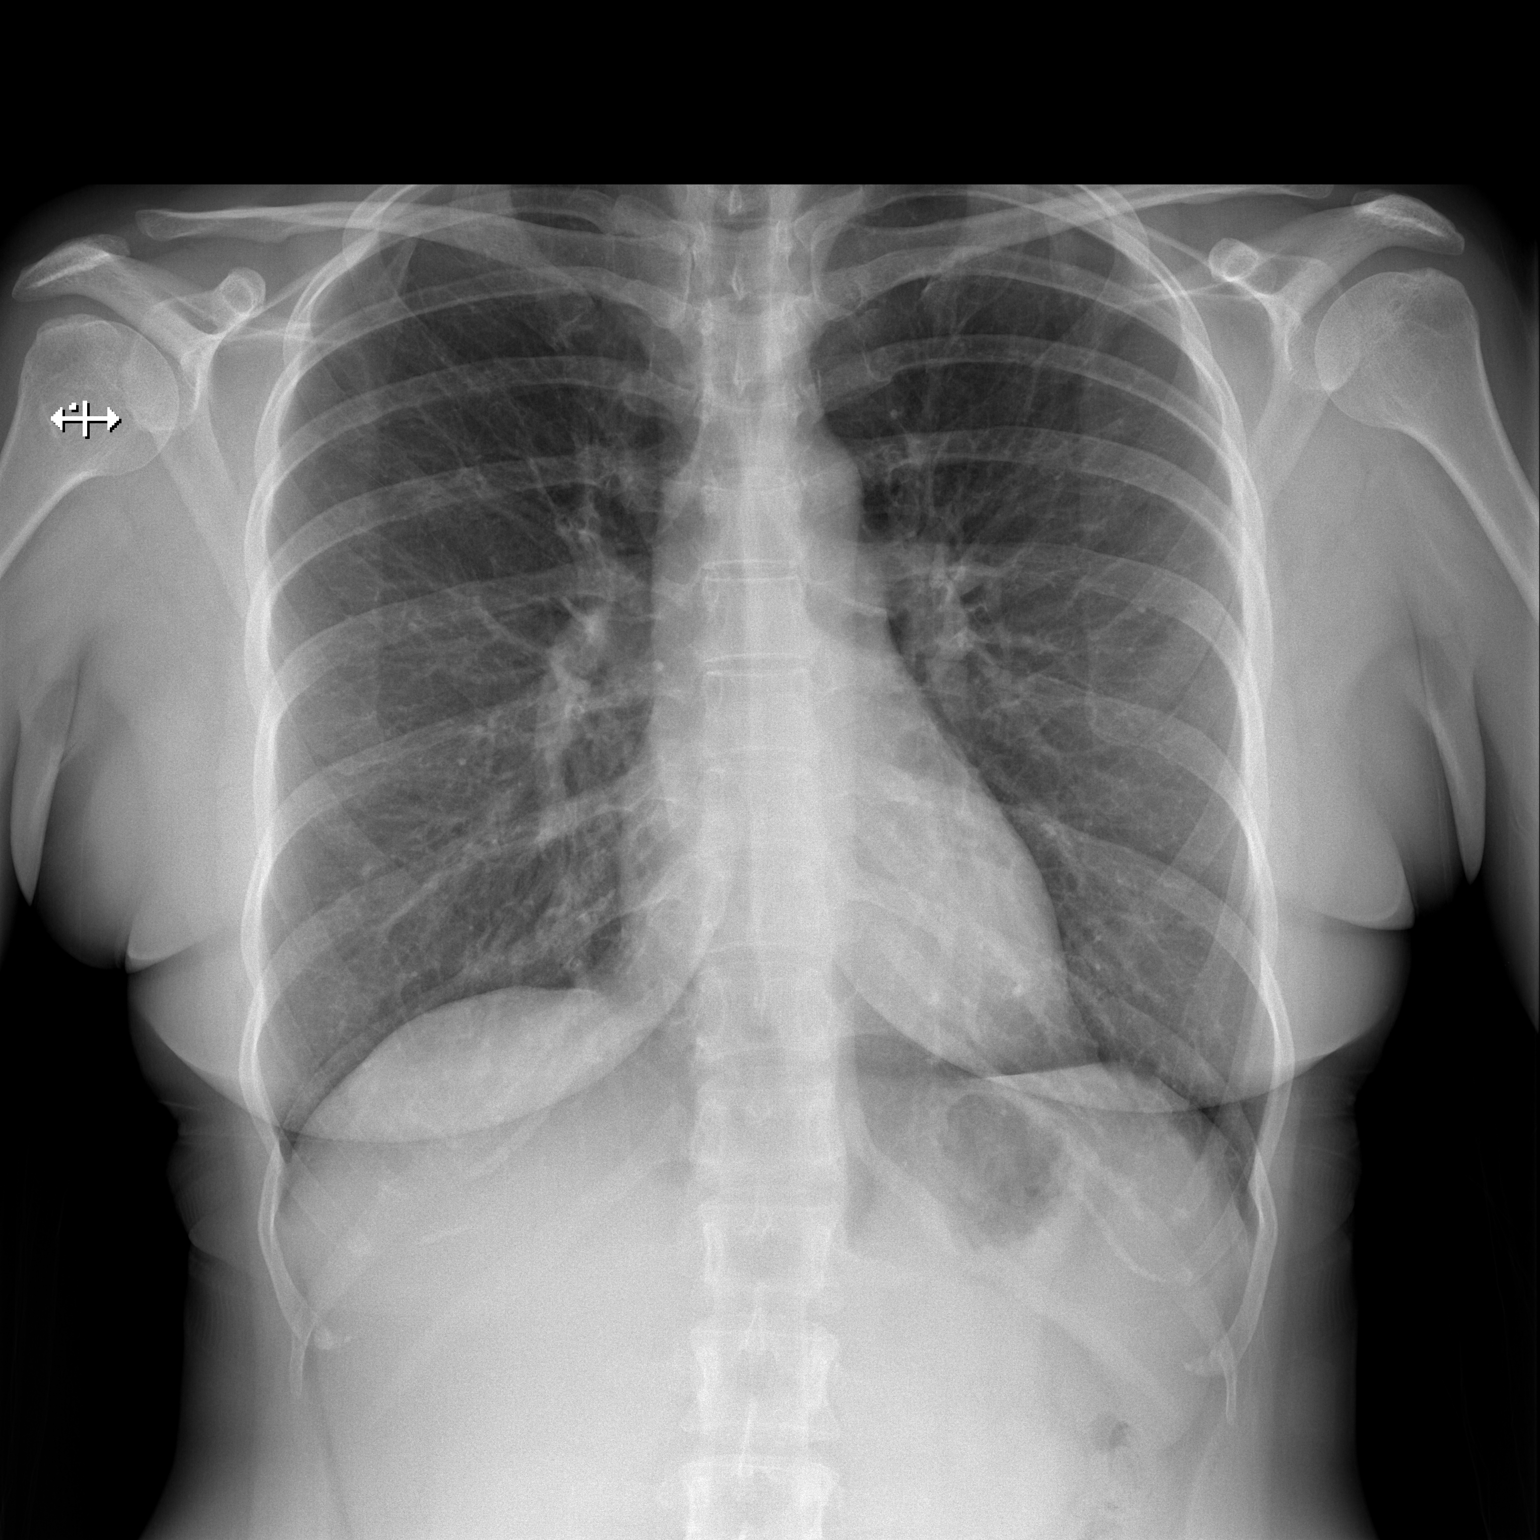
[im 2/2]
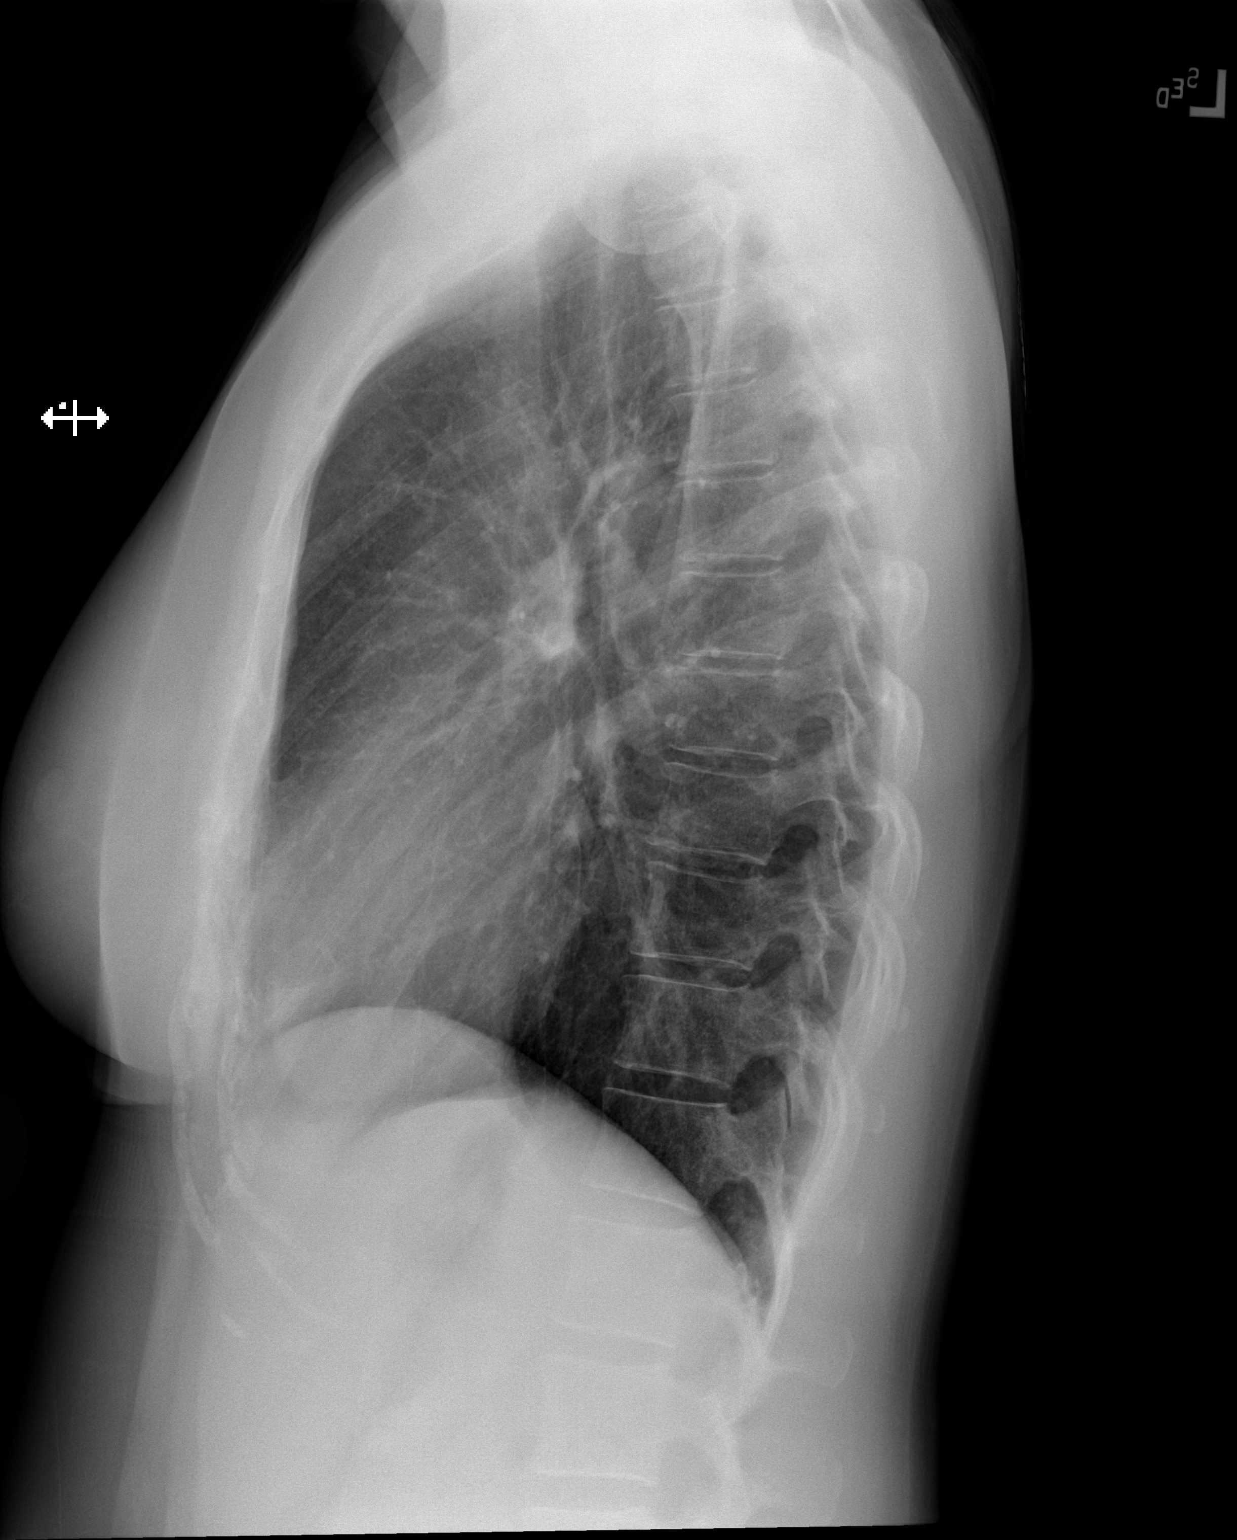

[2 of 2 positions shown; findings below may reference images not displayed]

FINDINGS: The heart size and mediastinal contours are within normal limits.
Both lungs are clear. No pneumothorax or pleural effusion is noted.
The visualized skeletal structures are unremarkable.
IMPRESSION: No active cardiopulmonary disease.

## 2023-08-23 ENCOUNTER — Other Ambulatory Visit: Payer: Self-pay

## 2023-08-23 DIAGNOSIS — Z1231 Encounter for screening mammogram for malignant neoplasm of breast: Secondary | ICD-10-CM

## 2023-12-27 NOTE — Progress Notes (Signed)
 Subjective Patient ID: Taylor Kirk is a 38 y.o. female.    38 year old female with no past medical history presents to the clinic complaining of chest tightness, lightheadedness and elevated blood pressure for the past 2 to 3 days.  Patient states that she began feeling strange several days ago and checked her blood pressure.  She noted that her blood pressure was elevated and she has been checking it over the last several days.  She also has been checking her heart rate and reports her heart rate will be in the 60s 70s when she is at rest and it will elevate into the low 100s with any type of minimal exertion.  And she does note chest tightness that will come and go with no specific activities.  She denies any other symptoms at this time.   History provided by:  Patient   Review of Systems  Constitutional:  Positive for fatigue. Negative for chills and fever.  HENT:  Negative for congestion, ear discharge, ear pain, rhinorrhea and sore throat.   Respiratory:  Positive for chest tightness. Negative for cough, shortness of breath and wheezing.   Cardiovascular:  Positive for chest pain and palpitations. Negative for leg swelling.  Gastrointestinal:  Negative for abdominal pain, diarrhea, nausea and vomiting.  Musculoskeletal:  Negative for arthralgias and myalgias.  Neurological:  Positive for light-headedness. Negative for headaches.    Patient History  Allergies: Allergies  Allergen Reactions  . Sulfa Antibiotics Rash    History reviewed. No pertinent past medical history. History reviewed. No pertinent surgical history. Social History   Socioeconomic History  . Marital status: Married    Spouse name: Not on file  . Number of children: Not on file  . Years of education: Not on file  . Highest education level: Not on file  Occupational History  . Not on file  Tobacco Use  . Smoking status: Never  . Smokeless tobacco: Never  Substance and Sexual Activity  . Alcohol  use: Not on file  . Drug use: Not on file  . Sexual activity: Not on file  Other Topics Concern  . Not on file  Social History Narrative  . Not on file   History reviewed. No pertinent family history. No current outpatient medications on file prior to visit.   No current facility-administered medications on file prior to visit.    Objective  Vitals:   12/27/23 1901 12/27/23 1910 12/27/23 1911 12/27/23 1912  BP: (!) 156/99 (!) 145/94 (!) 148/88 (!) 149/110  Patient Position:  Lying Sitting Standing  Pulse: 94 (!) 108 (!) 103 (!) 108  Resp: 19     Temp: 37.1 C (98.7 F)     TempSrc: Tympanic     SpO2: 99%     Weight: 72.1 kg     Height: 5' 4     PainSc: 0-No pain     LMP: 12/13/2023     Physical Exam  GENERAL APPEARANCE: afebrile, non-toxic and not acutely ill-appearing in NAD. Patient is speaking in full sentences with no increased work of breathing. EYES: EOM intact, PERRLA, with no conjunctival injection noted. NOSE: Nasal mucosa is without erythema or discharge.  THROAT: No erythema, swelling, or tonsillary exudate noted bilaterally. No soft palate petechiae noted. Uvula is midline and oral mucosa is intact. NECK: Soft, supple with no cervical adenopathy noted. HEART: RRR, S1 and S2 present with no obvious murmur noted. No rubs, gallops or clicks noted. LUNGS: CTA bilaterally with no wheezing, ronchi, rales or  crackles noted.   Results for orders placed or performed in visit on 12/27/23  ECG 12 lead   Narrative   ECG Interpretation:  Rhythm: Sinus tachycardia at 111 beats per minute Axis: Normal axis  Intervals: Normal PR interval QRS Complex: Normal ST Segment: Normal ST-T segments QT Interval: Normal  Compared with prior: No, None Available.   Summary of Clinical Condition:  Sinus tach with rate of 111  Interpretation by Fairy Jenny, PA       Procedures MDM:     1+ Acute illness or injury that poses a threat to life or bodily function      Explanation of Medical Decision Making and variances from expected care:    Advised the patient that she needs a higher level of care where on demand labs and advanced imaging may be used to form a definitive diagnosis and aid in formulating an appropriate treatment plan.     Unique ordered tests: One     Assessment requiring historian other than patient: No     Independent visualization of image, tracing, or test: No     Discussion of management with another provider: No     Risk:: High         Assessment/Plan Diagnoses and all orders for this visit:  Chest tightness -     ECG 12 lead  Elevated BP reading w/ no diagnosis of HTN     Disposition Status: Emergency Department  Patient Instructions  Advised the patient that she needs a higher level of care where on demand labs and advanced imaging may be used to form a definitive diagnosis and aid in formulating an appropriate treatment plan.   Progress note signed by Fairy Jenny, PA on 12/27/23 at  7:39 PM

## 2023-12-27 NOTE — Progress Notes (Addendum)
 High bp, lightheaded and chest tightness x 4 days

## 2023-12-28 ENCOUNTER — Emergency Department
Admission: EM | Admit: 2023-12-28 | Discharge: 2023-12-28 | Disposition: A | Discharge status: Home / Self Care | Attending: Emergency Medicine | Admitting: Emergency Medicine

## 2023-12-28 ENCOUNTER — Emergency Department

## 2023-12-28 ENCOUNTER — Other Ambulatory Visit: Payer: Self-pay

## 2023-12-28 DIAGNOSIS — I10 Essential (primary) hypertension: Secondary | ICD-10-CM | POA: Insufficient documentation

## 2023-12-28 DIAGNOSIS — R0789 Other chest pain: Secondary | ICD-10-CM

## 2023-12-28 DIAGNOSIS — R000 Tachycardia, unspecified: Secondary | ICD-10-CM | POA: Diagnosis not present

## 2023-12-28 LAB — BASIC METABOLIC PANEL WITH GFR
Anion gap: 12 (ref 5–15)
BUN: 19 mg/dL (ref 6–20)
CO2: 24 mmol/L (ref 22–32)
Calcium: 9.4 mg/dL (ref 8.9–10.3)
Chloride: 101 mmol/L (ref 98–111)
Creatinine, Ser: 0.71 mg/dL (ref 0.44–1.00)
GFR, Estimated: >60 mL/min (ref >60)
Glucose, Bld: 133 mg/dL — ABNORMAL HIGH (ref 70–99)
Potassium: 3.2 mmol/L — ABNORMAL LOW (ref 3.5–5.1)
Sodium: 137 mmol/L (ref 135–145)

## 2023-12-28 LAB — TSH: TSH: 2.683 u[IU]/mL (ref 0.350–4.500)

## 2023-12-28 LAB — CBC
HCT: 43.3 % (ref 36.0–46.0)
Hemoglobin: 14.1 g/dL (ref 12.0–15.0)
MCH: 29.4 pg (ref 26.0–34.0)
MCHC: 32.6 g/dL (ref 30.0–36.0)
MCV: 90.4 fL (ref 80.0–100.0)
Platelets: 285 K/uL (ref 150–400)
RBC: 4.79 MIL/uL (ref 3.87–5.11)
RDW: 12.4 % (ref 11.5–15.5)
WBC: 8.4 K/uL (ref 4.0–10.5)
nRBC: 0 % (ref 0.0–0.2)

## 2023-12-28 LAB — TROPONIN I (HIGH SENSITIVITY)
Troponin I (High Sensitivity): <2 ng/L (ref <18)
Troponin I (High Sensitivity): 3 ng/L (ref <18)

## 2023-12-28 LAB — D-DIMER, QUANTITATIVE: D-Dimer, Quant: <0.27 ug{FEU}/mL (ref 0.00–0.50)

## 2023-12-28 LAB — T4, FREE: Free T4: 0.82 ng/dL (ref 0.61–1.12)

## 2023-12-28 LAB — HCG, QUANTITATIVE, PREGNANCY: hCG, Beta Chain, Quant, S: <1 m[IU]/mL (ref <5)

## 2023-12-28 MED ORDER — METOPROLOL TARTRATE 25 MG PO TABS: 25.0000 mg | ORAL_TABLET | Freq: Two times a day (BID) | ORAL | 0 refills | Status: AC

## 2023-12-28 NOTE — ED Triage Notes (Signed)
 Patient states intermittent chest pain, blurred vision, tachycardia and elevated BP x 5 days.

## 2023-12-28 NOTE — Discharge Instructions (Addendum)
 Start taking the metoprolol as prescribed.  You should be contacted by the cardiology office within the next several days.  If you do not hear from them, you may call the number provided.  Return to the ER immediately for new, worsening, or persistent severe chest pain, difficulty breathing, severe high blood pressure, weakness or lightheadedness, palpitations, elevated heart rate, or any other new or worsening symptoms that concern you.

## 2023-12-28 NOTE — ED Provider Notes (Signed)
 [facility] Provider Note    Event Date/Time   First MD Initiated Contact with Patient 12/28/23 1719     (approximate)   History   Chest Pain   HPI  Taylor Kirk is a 38 y.o. female with no significant past medical history, not on any medications, who presents with chest discomfort over the last 5 days, intermittent course, described as a tightness or like a knot in her chest, and associated with lightheadedness.  The patient also reports elevated blood pressure readings multiple times at home over the last week, although she has had some lower ones as well.  The symptoms are not clearly exertional and they are intermittent.  She does not have any leg swelling.  She does report high heart rate when she exerts herself.  She feels tired more easily than normal.  She denies any cough or fever.  She has no vomiting or diarrhea.   I reviewed the past medical records.  The patient's most recent outpatient encounter was at urgent care yesterday for similar symptoms.  She was recommended to come to the ED for further evaluation.  Previously, her most recent outpatient encounter was with OB/GYN in 7/2 for an annual exam.   Physical Exam   Triage Vital Signs: ED Triage Vitals  Encounter Vitals Group     BP 12/28/23 1504 (!) 179/99     Girls Systolic BP Percentile --      Girls Diastolic BP Percentile --      Boys Systolic BP Percentile --      Boys Diastolic BP Percentile --      Pulse Rate 12/28/23 1504 (!) 114     Resp 12/28/23 1504 18     Temp 12/28/23 1504 98.1 F (36.7 C)     Temp Source 12/28/23 1504 Oral     SpO2 12/28/23 1504 100 %     Weight 12/28/23 1503 160 lb (72.6 kg)     Height 12/28/23 1503 5' 5 (1.651 m)     Head Circumference --      Peak Flow --      Pain Score 12/28/23 1501 4     Pain Loc --      Pain Education --      Exclude from Growth Chart --     Most recent vital signs: Vitals:   12/28/23 1720 12/28/23 1849  BP:  (!)  136/59  Pulse:  (!) 106  Resp:  18  Temp:  98.2 F (36.8 C)  SpO2: 100% 100%     General: Alert, well-appearing, no distress.  CV:  Good peripheral perfusion.  Normal heart sounds. Resp:  Normal effort.  Lungs CTAB. Abd:  No distention. Other:  No peripheral edema.  No calf or popliteal swelling or tenderness.   ED Results / Procedures / Treatments   Labs (all labs ordered are listed, but only abnormal results are displayed) Labs Reviewed  BASIC METABOLIC PANEL WITH GFR - Abnormal; Notable for the following components:      Result Value   Potassium 3.2 (*)    Glucose, Bld 133 (*)    All other components within normal limits  CBC  TSH  T4, FREE  HCG, QUANTITATIVE, PREGNANCY  D-DIMER, QUANTITATIVE  POC URINE PREG, ED  TROPONIN I (HIGH SENSITIVITY)  TROPONIN I (HIGH SENSITIVITY)     EKG  ED ECG REPORT I, Waylon Cassis, the attending physician, personally viewed and interpreted this ECG.  Date: 12/28/2023 EKG Time:  1503 Rate: 112 Rhythm: Sinus tachycardia QRS Axis: normal Intervals: Incomplete RBBB ST/T Wave abnormalities: normal Narrative Interpretation: no evidence of acute ischemia  ED ECG REPORT I, Waylon Cassis, the attending physician, personally viewed and interpreted this ECG.  Date: 12/28/2023 EKG Time: 1927 Rate: 75 Rhythm: normal sinus rhythm QRS Axis: normal Intervals: normal ST/T Wave abnormalities: normal Narrative Interpretation: no evidence of acute ischemia    RADIOLOGY  Chest x-ray: I independently viewed and interpreted the images; there is no focal consolidation or edema  PROCEDURES:  Critical Care performed: No  Procedures   MEDICATIONS ORDERED IN ED: Medications - No data to display   IMPRESSION / MDM / ASSESSMENT AND PLAN / ED COURSE  I reviewed the triage vital signs and the nursing notes.  38 year old female with PMH as noted above presents with atypical chest discomfort, palpitations, and elevated blood  pressure readings over the last 5 days.  Physical exam is unremarkable for acute findings except for borderline tachycardia.  EKG is nonischemic; it is read as prolonged QT but I think that this may be spurious due to the rate.  Differential diagnosis includes, but is not limited to, sinus tachycardia, dehydration, electrolyte abnormality, cardiac dysrhythmia, POTS, less likely PE, ACS.  There is no evidence of aortic dissection or other vascular cause.  Initial and repeat troponin are both negative.  BMP and CBC show no acute findings.  Thyroid  function tests are within normal limits.  I will add on a D-dimer and reassess.  Patient's presentation is most consistent with acute complicated illness / injury requiring diagnostic workup.  The patient is on the cardiac monitor to evaluate for evidence of arrhythmia and/or significant heart rate changes.  ----------------------------------------- 7:46 PM on 12/28/2023 -----------------------------------------  Repeat EKG confirms that the patient does not have a prolonged QTc, and is otherwise normal.  Her heart rate has normalized without any intervention.  D-dimer is negative.  At this time, she is stable for discharge home.  I have ordered her cardiology referral.  Given the elevated blood pressure as well as the tendency towards elevated heart rate, I will start her on a beta-blocker while awaiting cardiology follow-up.  I have prescribed a low-dose of metoprolol.  I counseled the patient on the results of the workup and plan of care, and she is in agreement.  She is stable for discharge home at this time.  I gave strict return precautions, and she expressed understanding.  FINAL CLINICAL IMPRESSION(S) / ED DIAGNOSES   Final diagnoses:  Hypertension, unspecified type  Atypical chest pain  Tachycardia     Rx / DC Orders   ED Discharge Orders          Ordered    metoprolol tartrate (LOPRESSOR) 25 MG tablet  2 times daily        12/28/23 1944     Ambulatory referral to Cardiology       Comments: If you have not heard from the Cardiology office within the next 72 hours please call [phone].   12/28/23 1944             Note:  This document was prepared using Dragon voice recognition software and may include unintentional dictation errors.    Cassis Waylon, MD 12/28/23 1948

## 2023-12-28 NOTE — ED Provider Triage Note (Signed)
 Emergency Medicine Provider Triage Evaluation Note  Taylor Kirk , a 38 y.o. female  was evaluated in triage.  Pt complains of intermittent chest pain and palpitations x 5 days ago. Denies shortness of breath, nausea, vomiting. No cardiac history. No new medications.   Review of Systems  Positive:  Negative:   Physical Exam  BP (!) 179/99   Pulse (!) 114   Temp 98.1 F (36.7 C) (Oral)   Resp 18   Ht 5' 5 (1.651 m)   Wt 72.6 kg   SpO2 100%   BMI 26.63 kg/m  Gen:   Awake, no distress   Resp:  Normal effort LCTAB MSK:   Moves extremities without difficulty  Other:  CV - tachy on auscultation. No murmurs, rubs or gallop noted. No peripheral edema noted.   Medical Decision Making  Medically screening exam initiated at 4:47 PM.  Appropriate orders placed.  Dalicia Kisner was informed that the remainder of the evaluation will be completed by another provider, this initial triage assessment does not replace that evaluation, and the importance of remaining in the ED until their evaluation is complete.    Margrette, Evan Osburn A, PA-C 12/28/23 1650

## 2024-01-12 ENCOUNTER — Ambulatory Visit: Admitting: Internal Medicine
